# Patient Record
Sex: Female | Born: 1950 | Race: White | Hispanic: No | Marital: Married | Smoking: Current every day smoker
Health system: Southern US, Community
[De-identification: ages and names within clinical notes are randomized; demographics above are authoritative.]

## PROBLEM LIST (undated history)

## (undated) ENCOUNTER — Emergency Department (HOSPITAL_BASED_OUTPATIENT_CLINIC_OR_DEPARTMENT_OTHER): Admission: EM | Payer: Self-pay | Source: Home / Self Care

## (undated) DIAGNOSIS — C3490 Malignant neoplasm of unspecified part of unspecified bronchus or lung: Secondary | ICD-10-CM

## (undated) DIAGNOSIS — IMO0002 Reserved for concepts with insufficient information to code with codable children: Secondary | ICD-10-CM

## (undated) DIAGNOSIS — F419 Anxiety disorder, unspecified: Secondary | ICD-10-CM

## (undated) DIAGNOSIS — M329 Systemic lupus erythematosus, unspecified: Secondary | ICD-10-CM

## (undated) DIAGNOSIS — C55 Malignant neoplasm of uterus, part unspecified: Secondary | ICD-10-CM

## (undated) HISTORY — DX: Malignant neoplasm of unspecified part of unspecified bronchus or lung: C34.90

## (undated) HISTORY — PX: ABDOMINAL HYSTERECTOMY: SHX81

## (undated) HISTORY — PX: APPENDECTOMY: SHX54

## (undated) HISTORY — PX: CHOLECYSTECTOMY: SHX55

---

## 2014-10-17 ENCOUNTER — Ambulatory Visit (INDEPENDENT_AMBULATORY_CARE_PROVIDER_SITE_OTHER): Payer: Self-pay | Admitting: Emergency Medicine

## 2014-10-17 VITALS — BP 126/74 | HR 81 | Temp 98.0°F | Resp 17 | Ht 63.5 in | Wt 108.0 lb

## 2014-10-17 DIAGNOSIS — S335XXA Sprain of ligaments of lumbar spine, initial encounter: Secondary | ICD-10-CM

## 2014-10-17 MED ORDER — CYCLOBENZAPRINE HCL 10 MG PO TABS: 10.0000 mg | ORAL_TABLET | Freq: Three times a day (TID) | ORAL | Status: DC | PRN

## 2014-10-17 MED ORDER — ACETAMINOPHEN-CODEINE #3 300-30 MG PO TABS: 1.0000 | ORAL_TABLET | ORAL | Status: DC | PRN

## 2014-10-17 MED ORDER — NAPROXEN SODIUM 550 MG PO TABS: 550.0000 mg | ORAL_TABLET | Freq: Two times a day (BID) | ORAL | Status: DC

## 2014-10-17 NOTE — Progress Notes (Signed)
[facility] [address], [place] [address] [phone]  Date:  10/17/2014   Name:  Laura Vang   DOB:  1951-05-24   MRN:  [id number]  PCP:  No primary care provider on file.    Chief Complaint: Back Pain   History of Present Illness:  Laura Vang is a 64 y.o. very pleasant female patient who presents with the following:  Patient says three weeks ago she carried in a load of firewood for the fireplace and since has experienced left lower back pain Non radiating.  No neuro symptoms or weakness. No history of fall or other direct injury. Says pain worse with laying down.  Moving around helps pain No improvement with over the counter medications or other home remedies.  No history of prior back injury. Smokes 1 PPD Not working. No improvement with over the counter medications or other home remedies.  Denies other complaint or health concern today.   There are no active problems to display for this patient.   History reviewed. No pertinent past medical history.  Past Surgical History  Procedure Laterality Date  . Appendectomy    . Cholecystectomy      History  Substance Use Topics  . Smoking status: Current Every Day Smoker -- 1.00 packs/day for 41 years    Types: Cigarettes  . Smokeless tobacco: Not on file  . Alcohol Use: No    Family History  Problem Relation Age of Onset  . Cancer Mother   . Cancer Brother     No Known Allergies  Medication list has been reviewed and updated.  No current outpatient prescriptions on file prior to visit.   No current facility-administered medications on file prior to visit.    Review of Systems:  As per HPI, otherwise negative.    Physical Examination: Filed Vitals:   10/17/14 0829  BP: 126/74  Pulse: 81  Temp: 98 F (36.7 C)  Resp: 17   Filed Vitals:   10/17/14 0829  Height: 5' 3.5" (1.613 m)  Weight: 108 lb (48.988 kg)   Body mass index is 18.83 kg/(m^2). Ideal Body  Weight: Weight in (lb) to have BMI = 25: 143.1  GEN: WDWN, NAD, Non-toxic, A & O x 3 HEENT: Atraumatic, Normocephalic. Neck supple. No masses, No LAD. Ears and Nose: No external deformity. CV: RRR, No M/G/R. No JVD. No thrill. No extra heart sounds. PULM: CTA B, no wheezes, crackles, rhonchi. No retractions. No resp. distress. No accessory muscle use. ABD: S, NT, ND, +BS. No rebound. No HSM. EXTR: No c/c/e NEURO Normal gait.  PSYCH: Normally interactive. Conversant. Not depressed or anxious appearing.  Calm demeanor.  BACK:  Tender and spasm left paraspinous muscles.  Motor intact  Assessment and Plan: Lumbar strain Anaprox Flexeril tyl #3   Signed,  Ellison Carwin, MD

## 2014-10-17 NOTE — Patient Instructions (Signed)

## 2014-11-06 ENCOUNTER — Other Ambulatory Visit: Payer: Self-pay

## 2014-11-06 NOTE — Telephone Encounter (Signed)
Patient needs refills for flexeril, tylenol (want an mg increase), and naproxin sent to [facility] on [address].

## 2014-11-07 NOTE — Telephone Encounter (Signed)
Patient's husband came in to follow up on refill request. He was informed that a message will be sent to the doctor and we'll give him a phone a call. Please call Laura Vang! [phone]

## 2014-11-07 NOTE — Telephone Encounter (Signed)
Dr Ouida Sills, do you want to RF these? Pt's husband came into 17 to check on these this morn and I had check in staff tell him we would contact them after you review the req.

## 2014-11-08 NOTE — Telephone Encounter (Signed)
Patient should come n for follow up if still having a problem requiring a narcotic 2 weeks later

## 2014-11-08 NOTE — Telephone Encounter (Signed)
Notified husband that pt needs re-check and he agreed to bring her up to be seen.

## 2014-11-13 ENCOUNTER — Ambulatory Visit (INDEPENDENT_AMBULATORY_CARE_PROVIDER_SITE_OTHER): Payer: Self-pay | Admitting: Internal Medicine

## 2014-11-13 ENCOUNTER — Ambulatory Visit (INDEPENDENT_AMBULATORY_CARE_PROVIDER_SITE_OTHER): Payer: Self-pay

## 2014-11-13 VITALS — BP 124/80 | HR 105 | Temp 98.2°F | Resp 18 | Ht 64.25 in | Wt 104.8 lb

## 2014-11-13 DIAGNOSIS — F17200 Nicotine dependence, unspecified, uncomplicated: Secondary | ICD-10-CM | POA: Insufficient documentation

## 2014-11-13 DIAGNOSIS — S335XXD Sprain of ligaments of lumbar spine, subsequent encounter: Secondary | ICD-10-CM

## 2014-11-13 DIAGNOSIS — Z681 Body mass index (BMI) 19 or less, adult: Secondary | ICD-10-CM | POA: Insufficient documentation

## 2014-11-13 DIAGNOSIS — M5136 Other intervertebral disc degeneration, lumbar region: Secondary | ICD-10-CM

## 2014-11-13 MED ORDER — METHOCARBAMOL 500 MG PO TABS: 500.0000 mg | ORAL_TABLET | Freq: Four times a day (QID) | ORAL | Status: DC | PRN

## 2014-11-13 MED ORDER — HYDROCODONE-ACETAMINOPHEN 5-325 MG PO TABS: 1.0000 | ORAL_TABLET | Freq: Four times a day (QID) | ORAL | Status: DC | PRN

## 2014-11-13 MED ORDER — PREDNISONE 20 MG PO TABS: ORAL_TABLET | ORAL | Status: DC

## 2014-11-13 NOTE — Progress Notes (Signed)
Patient ID: Laura Vang, female    DOB: [birth year], 64 y.o.   MRN: [id number]  PCP: No primary care provider on file.  Subjective:   Chief Complaint  Patient presents with  . Back Pain    lower back pain. x2 month sharp pain, especially at night.     HPI  Back pain x 2 months after lifting a heavy log for the wood burning stove. LEFT side, radiating into the buttock and the LEFT thigh. No paresthesias or lower extremity weakness. No loss of bowel or bladder control. No saddle anesthesia.  Seen here about 3 weeks after the injury, diagnosed with lumbar strain. Given Anaprox, Flexeril and Tylenol #3.No evidence. Increased pain with staying in any one position too long. Pain is keeping her from sleeping, because she cannot be still long enough to fall/stay asleep.  Review of Systems Review of Systems  Constitutional: Negative for fever and chills.  Respiratory: Negative for shortness of breath and wheezing.   Cardiovascular: Negative for chest pain, palpitations and leg swelling.  Gastrointestinal: Negative for blood in stool.  Genitourinary: Negative for dysuria, urgency, frequency and hematuria.  Musculoskeletal: Positive for back pain. Negative for gait problem.  Skin: Negative for rash.  Neurological: Negative for weakness and numbness.       Patient Active Problem List   Diagnosis Date Noted  . Smoker 11/13/2014  . BMI less than 19,adult 11/13/2014     Prior to Admission medications   Not on File     No Known Allergies     Objective:  Physical Exam  Physical Exam  Constitutional: She is oriented to person, place, and time. She appears well-developed and well-nourished. She is active and cooperative. No distress.  BP 124/80 mmHg  Pulse 105  Temp(Src) 98.2 F (36.8 C) (Oral)  Resp 18  Ht 5' 4.25" (1.632 m)  Wt 104 lb 12.8 oz (47.537 kg)  BMI 17.85 kg/m2  SpO2 97%   Eyes: Conjunctivae are normal.  Neck: Neck supple. No thyromegaly present.    Pulmonary/Chest: Effort normal.  Musculoskeletal:       Cervical back: Normal.       Thoracic back: Normal.       Lumbar back: She exhibits tenderness and pain. She exhibits normal range of motion, no bony tenderness, no swelling, no edema, no deformity, no laceration, no spasm and normal pulse.       Back:  Lymphadenopathy:    She has no cervical adenopathy.  Neurological: She is alert and oriented to person, place, and time. She has normal strength. No sensory deficit.  Reflex Scores:      Bicep reflexes are 2+ on the right side and 2+ on the left side.      Patellar reflexes are 2+ on the right side and 2+ on the left side.      Achilles reflexes are 2+ on the right side and 2+ on the left side. Skin: Skin is warm and dry.  Psychiatric: She has a normal mood and affect. Her speech is normal and behavior is normal.   LS Spine: [facility] reading (PRIMARY) by  Dr. Laney Pastor. Surgical clips consistent with cholecystectomy. Loss of lordotic curve. Some anterior spurring. Mild anterolisthesis at L4-L5.          Assessment & Plan:  1. Sprain of lumbar region, subsequent encounter - DG Lumbar Spine Complete; Future  2. Degenerative disc disease, lumbar Acute strain on chronic degenerative disease. Treat with oral prednisone, robaxin and hydrocodone. If  symptoms persist, or improve then recur, RTC. Consider MRI at that time. - predniSONE (DELTASONE) 20 MG tablet; Take 3 PO QAM x3days, 2 PO QAM x3days, 1 PO QAM x3days  Dispense: 18 tablet; Refill: 0 - HYDROcodone-acetaminophen (NORCO) 5-325 MG per tablet; Take 1 tablet by mouth every 6 (six) hours as needed.  Dispense: 30 tablet; Refill: 0 - methocarbamol (ROBAXIN) 500 MG tablet; Take 1 tablet (500 mg total) by mouth every 6 (six) hours as needed for muscle spasms.  Dispense: 30 tablet; Refill: 0   Fara Chute, PA-C Physician Assistant-Certified [facility]  I have participated in the  care of this patient with the Advanced Practice Provider and agree with Diagnosis and Plan as documented. [name] P. Laney Pastor, M.D.

## 2014-11-25 ENCOUNTER — Telehealth: Payer: Self-pay

## 2014-11-25 DIAGNOSIS — M5136 Other intervertebral disc degeneration, lumbar region: Secondary | ICD-10-CM

## 2014-11-25 MED ORDER — HYDROCODONE-ACETAMINOPHEN 5-325 MG PO TABS: 1.0000 | ORAL_TABLET | Freq: Four times a day (QID) | ORAL | Status: DC | PRN

## 2014-11-25 NOTE — Telephone Encounter (Signed)
I have refilled her pain medication. 20 pills with no refills. It is waiting for her at [facility]. Chelle's note states that if pt didn't improve she would need to return for further evaluation and possible imaging. So pt should come in to be seen to determine if a scan is necessary. She will need to come in for any further refills of her pain medicine. If she is having loss of bowel or bladder control she needs to go to the ER ASAP!

## 2014-11-25 NOTE — Telephone Encounter (Signed)
Patients husband came in today stating that his wife was worse and that something needed to be done. He stated that [place] had told her on the 11/13/14 that if it didn't get better then she might need to get an MRI done to see where the pain was coming from. He also stated that she needed a refill on her Vicodin. If there is anyway this can be done ASAP it would be great he said she is up all hours of the night and that he is having to help her get in and out of bed.

## 2014-11-25 NOTE — Telephone Encounter (Signed)
Pt wants refill of hydrocodone due to extreme back pain.

## 2014-11-26 NOTE — Telephone Encounter (Signed)
Tried to call pt. VM box not set up.

## 2014-11-28 NOTE — Telephone Encounter (Signed)
Spoke with pt and gave her message.

## 2014-11-29 ENCOUNTER — Telehealth: Payer: Self-pay

## 2014-11-29 NOTE — Telephone Encounter (Signed)
Spoke with pt, she states she wanted a refill of the Prednisone and Robaxin. i advised her off todd's previous message. He stated pt needed to come in if she was not better. Pt understood.

## 2014-11-29 NOTE — Telephone Encounter (Signed)
Pt states the Dr was going to call her in a muscle relaxer and something for inflammation and it hasn't been taken care of. Please call [place]

## 2014-12-12 ENCOUNTER — Ambulatory Visit (INDEPENDENT_AMBULATORY_CARE_PROVIDER_SITE_OTHER): Payer: Self-pay | Admitting: Physician Assistant

## 2014-12-12 VITALS — BP 140/76 | HR 103 | Temp 98.2°F | Resp 18 | Ht 64.25 in | Wt 101.0 lb

## 2014-12-12 DIAGNOSIS — S335XXD Sprain of ligaments of lumbar spine, subsequent encounter: Secondary | ICD-10-CM

## 2014-12-12 MED ORDER — MELOXICAM 15 MG PO TABS: 15.0000 mg | ORAL_TABLET | Freq: Every day | ORAL | Status: DC

## 2014-12-12 NOTE — Patient Instructions (Signed)
Please use the mobic.  Do not use the Goody powder with this medication.  You may use tylenol as prescribed over the counter with this medication.    Please ice the back 3-4 times per day for 15 minutes.  Then use heat just prior doing your stretches.  Then ice immediately after. Please await contact for MRI scheduling.    Back Exercises These exercises may help you when beginning to rehabilitate your injury. Your symptoms may resolve with or without further involvement from your physician, physical therapist or athletic trainer. While completing these exercises, remember:   Restoring tissue flexibility helps normal motion to return to the joints. This allows healthier, less painful movement and activity.  An effective stretch should be held for at least 30 seconds.  A stretch should never be painful. You should only feel a gentle lengthening or release in the stretched tissue. STRETCH - Extension, Prone on Elbows   Lie on your stomach on the floor, a bed will be too soft. Place your palms about shoulder width apart and at the height of your head.    Place your elbows under your shoulders. If this is too painful, stack pillows under your chest.  Allow your body to relax so that your hips drop lower and make contact more completely with the floor.  Hold this position for __________ seconds.  Slowly return to lying flat on the floor. Repeat __________ times. Complete this exercise __________ times per day.  RANGE OF MOTION - Extension, Prone Press Ups   Lie on your stomach on the floor, a bed will be too soft. Place your palms about shoulder width apart and at the height of your head.  Keeping your back as relaxed as possible, slowly straighten your elbows while keeping your hips on the floor. You may adjust the placement of your hands to maximize your comfort. As you gain motion, your hands will come more underneath your shoulders.  Hold this position __________ seconds.  Slowly  return to lying flat on the floor. Repeat __________ times. Complete this exercise __________ times per day.  RANGE OF MOTION- Quadruped, Neutral Spine   Assume a hands and knees position on a firm surface. Keep your hands under your shoulders and your knees under your hips. You may place padding under your knees for comfort.  Drop your head and point your tail bone toward the ground below you. This will round out your low back like an angry cat. Hold this position for __________ seconds.  Slowly lift your head and release your tail bone so that your back sags into a large arch, like an old horse.  Hold this position for __________ seconds.  Repeat this until you feel limber in your low back.  Now, find your "sweet spot." This will be the most comfortable position somewhere between the two previous positions. This is your neutral spine. Once you have found this position, tense your stomach muscles to support your low back.  Hold this position for __________ seconds. Repeat __________ times. Complete this exercise __________ times per day.  STRETCH - Flexion, Single Knee to Chest   Lie on a firm bed or floor with both legs extended in front of you.  Keeping one leg in contact with the floor, bring your opposite knee to your chest. Hold your leg in place by either grabbing behind your thigh or at your knee.  Pull until you feel a gentle stretch in your low back. Hold __________ seconds.  Slowly  release your grasp and repeat the exercise with the opposite side. Repeat __________ times. Complete this exercise __________ times per day.  STRETCH - Hamstrings, Standing  Stand or sit and extend your right / left leg, placing your foot on a chair or foot stool  Keeping a slight arch in your low back and your hips straight forward.  Lead with your chest and lean forward at the waist until you feel a gentle stretch in the back of your right / left knee or thigh. (When done correctly, this  exercise requires leaning only a small distance.)  Hold this position for __________ seconds. Repeat __________ times. Complete this stretch __________ times per day. STRENGTHENING - Deep Abdominals, Pelvic Tilt   Lie on a firm bed or floor. Keeping your legs in front of you, bend your knees so they are both pointed toward the ceiling and your feet are flat on the floor.  Tense your lower abdominal muscles to press your low back into the floor. This motion will rotate your pelvis so that your tail bone is scooping upwards rather than pointing at your feet or into the floor.  With a gentle tension and even breathing, hold this position for __________ seconds. Repeat __________ times. Complete this exercise __________ times per day.  STRENGTHENING - Abdominals, Crunches   Lie on a firm bed or floor. Keeping your legs in front of you, bend your knees so they are both pointed toward the ceiling and your feet are flat on the floor. Cross your arms over your chest.  Slightly tip your chin down without bending your neck.  Tense your abdominals and slowly lift your trunk high enough to just clear your shoulder blades. Lifting higher can put excessive stress on the low back and does not further strengthen your abdominal muscles.  Control your return to the starting position. Repeat __________ times. Complete this exercise __________ times per day.  STRENGTHENING - Quadruped, Opposite UE/LE Lift   Assume a hands and knees position on a firm surface. Keep your hands under your shoulders and your knees under your hips. You may place padding under your knees for comfort.  Find your neutral spine and gently tense your abdominal muscles so that you can maintain this position. Your shoulders and hips should form a rectangle that is parallel with the floor and is not twisted.  Keeping your trunk steady, lift your right hand no higher than your shoulder and then your left leg no higher than your hip. Make  sure you are not holding your breath. Hold this position __________ seconds.  Continuing to keep your abdominal muscles tense and your back steady, slowly return to your starting position. Repeat with the opposite arm and leg. Repeat __________ times. Complete this exercise __________ times per day. Document Released: 08/16/2005 Document Revised: 10/21/2011 Document Reviewed: 11/10/2008 [facility] Patient Information 2015 [place], [place]. This information is not intended to replace advice given to you by your health care provider. Make sure you discuss any questions you have with your health care provider.

## 2014-12-12 NOTE — Progress Notes (Signed)
[facility] [address] [place] [address] [phone]  Date:  12/12/2014   Name:  Laura Vang   DOB:  Jun 17, 1951   MRN:  [id number]  PCP:  No primary care provider on file.    Chief Complaint: Back Pain   History of Present Illness:  Laura Vang is a 64 y.o. very pleasant female patient who presents with the following:  Patient is here today with continued lower lumbar back pain complaint.  She was seen here 10/17/2014 for this back pain following onset after moving wood.  She states that she has attempted the robaxin and norco which have done nothing for the pain.  When I mention the prednisone taper, she has no understanding of ever taking this medication.  She states that she has no fever, loss of bowel or urination, though she has had a 6lb weight loss within this time.  She takes advil which helps to relieve the pain, but states that she gets the best results from bc powder.  She takes 8 per day mixed with water.  She has no abdominal pain or blood in stool.  She is not doing any stretches, or used any thermotherapy.   Patient Active Problem List   Diagnosis Date Noted  . Smoker 11/13/2014  . BMI less than 19,adult 11/13/2014    History reviewed. No pertinent past medical history.  Past Surgical History  Procedure Laterality Date  . Appendectomy    . Cholecystectomy    . Abdominal hysterectomy      History  Substance Use Topics  . Smoking status: Current Every Day Smoker -- 1.00 packs/day for 41 years    Types: Cigarettes  . Smokeless tobacco: Never Used  . Alcohol Use: No    Family History  Problem Relation Age of Onset  . Cancer Mother   . Cancer Brother   . Heart disease Father     No Known Allergies  Medication list has been reviewed and updated.  Current Outpatient Prescriptions on File Prior to Visit  Medication Sig Dispense Refill  . HYDROcodone-acetaminophen (NORCO) 5-325 MG per tablet Take 1 tablet by mouth every 6  (six) hours as needed. (Patient not taking: Reported on 12/12/2014) 20 tablet 0  . methocarbamol (ROBAXIN) 500 MG tablet Take 1 tablet (500 mg total) by mouth every 6 (six) hours as needed for muscle spasms. (Patient not taking: Reported on 12/12/2014) 30 tablet 0  . predniSONE (DELTASONE) 20 MG tablet Take 3 PO QAM x3days, 2 PO QAM x3days, 1 PO QAM x3days (Patient not taking: Reported on 12/12/2014) 18 tablet 0   No current facility-administered medications on file prior to visit.    Review of Systems: ROS otherwise unremarkable unless listed above.  Physical Examination: Filed Vitals:   12/12/14 0811  BP: 140/76  Pulse: 103  Temp: 98.2 F (36.8 C)  Resp: 18   Filed Vitals:   12/12/14 0811  Height: 5' 4.25" (1.632 m)  Weight: 101 lb (45.813 kg)   Body mass index is 17.2 kg/(m^2). Ideal Body Weight: Weight in (lb) to have BMI = 25: 146.5 Wt Readings from Last 3 Encounters:  12/12/14 101 lb (45.813 kg)  11/13/14 104 lb 12.8 oz (47.537 kg)  10/17/14 108 lb (48.988 kg)   Physical Exam  Constitutional: She is oriented to person, place, and time. She appears well-developed and well-nourished.  HENT:  Head: Normocephalic and atraumatic.  Eyes: EOM are normal. Pupils are equal, round, and reactive to light.  Cardiovascular: Normal rate, regular rhythm and normal heart sounds.  Exam reveals no friction rub.   No murmur heard. Pulmonary/Chest: Effort normal and breath sounds normal. No respiratory distress. She has no wheezes.  Musculoskeletal:  Lower lumbar without visual erythema, or swelling.  Right lower lumbar sensitive with palpation extending inferiorly to the sacrum.  Adjacent musculature tender to palpation.  Forward flexion at 45 degrees.  ROM limited with right lateral flexion due to pain at the left lumbar musculature.  The left side has lateral deviation that is better but still decreased.  Normal patellar reflexes.  Neurological: She is alert and oriented to person, place, and  time.  Psychiatric: She has a normal mood and affect. Her behavior is normal.      Assessment and Plan: 64 year old female is here today complaining of continued back pain.  At this time I am requesting an MRI.  She has very little mobility, and musculature should be improved at this time.  I have advised her to start the mobic, and to discontinue the bc powder and advil.  The bc powder I am suspicious is contributing to her tachycardia at this time, and loss of appetite.  I have given her stretches to do in the meantime as we await imaging.  And have advised icing 3-4 times per day.  Sprain of lumbar region, subsequent encounter - Plan: meloxicam (MOBIC) 15 MG tablet, MR Lumbar Spine Wo Contrast  Ivar Drape, PA-C [facility] 5/4/20166:54 AM

## 2014-12-23 ENCOUNTER — Ambulatory Visit
Admission: RE | Admit: 2014-12-23 | Discharge: 2014-12-23 | Disposition: A | Discharge status: Home / Self Care | Payer: No Typology Code available for payment source | Source: Ambulatory Visit | Attending: Physician Assistant | Admitting: Physician Assistant

## 2014-12-24 ENCOUNTER — Telehealth: Payer: Self-pay | Admitting: Radiology

## 2014-12-24 NOTE — Telephone Encounter (Signed)
Spoke with patient and she reports she will be in first thing on 12/25/14.  Informed her of how concerning these findings were and that she should be seen ASAP, however she says that she will come in in the morning. Philis Fendt, MS, PA-C 11:53 AM, 12/24/2014

## 2014-12-24 NOTE — Telephone Encounter (Signed)
[facility] called about MRI--it showed large mass. I have talked to Legrand [place], [place] about this and he will contact [place].

## 2014-12-25 ENCOUNTER — Ambulatory Visit (INDEPENDENT_AMBULATORY_CARE_PROVIDER_SITE_OTHER): Payer: Self-pay

## 2014-12-25 ENCOUNTER — Ambulatory Visit (HOSPITAL_BASED_OUTPATIENT_CLINIC_OR_DEPARTMENT_OTHER)
Admission: RE | Admit: 2014-12-25 | Discharge: 2014-12-25 | Disposition: A | Discharge status: Home / Self Care | Payer: Self-pay | Source: Ambulatory Visit | Attending: Physician Assistant | Admitting: Physician Assistant

## 2014-12-25 ENCOUNTER — Ambulatory Visit (INDEPENDENT_AMBULATORY_CARE_PROVIDER_SITE_OTHER): Payer: Self-pay | Admitting: Family Medicine

## 2014-12-25 VITALS — BP 136/70 | HR 86 | Temp 98.3°F | Resp 14 | Ht 63.75 in | Wt 104.8 lb

## 2014-12-25 DIAGNOSIS — N390 Urinary tract infection, site not specified: Secondary | ICD-10-CM

## 2014-12-25 DIAGNOSIS — R634 Abnormal weight loss: Secondary | ICD-10-CM

## 2014-12-25 DIAGNOSIS — R229 Localized swelling, mass and lump, unspecified: Secondary | ICD-10-CM

## 2014-12-25 DIAGNOSIS — B3731 Acute candidiasis of vulva and vagina: Secondary | ICD-10-CM

## 2014-12-25 DIAGNOSIS — Z87891 Personal history of nicotine dependence: Secondary | ICD-10-CM

## 2014-12-25 DIAGNOSIS — R8271 Bacteriuria: Secondary | ICD-10-CM

## 2014-12-25 DIAGNOSIS — R222 Localized swelling, mass and lump, trunk: Secondary | ICD-10-CM

## 2014-12-25 DIAGNOSIS — R918 Other nonspecific abnormal finding of lung field: Secondary | ICD-10-CM | POA: Insufficient documentation

## 2014-12-25 DIAGNOSIS — A499 Bacterial infection, unspecified: Secondary | ICD-10-CM

## 2014-12-25 DIAGNOSIS — B9689 Other specified bacterial agents as the cause of diseases classified elsewhere: Secondary | ICD-10-CM

## 2014-12-25 DIAGNOSIS — N760 Acute vaginitis: Secondary | ICD-10-CM

## 2014-12-25 DIAGNOSIS — IMO0002 Reserved for concepts with insufficient information to code with codable children: Secondary | ICD-10-CM

## 2014-12-25 DIAGNOSIS — K5901 Slow transit constipation: Secondary | ICD-10-CM

## 2014-12-25 DIAGNOSIS — B373 Candidiasis of vulva and vagina: Secondary | ICD-10-CM

## 2014-12-25 DIAGNOSIS — J984 Other disorders of lung: Secondary | ICD-10-CM

## 2014-12-25 DIAGNOSIS — R29898 Other symptoms and signs involving the musculoskeletal system: Secondary | ICD-10-CM

## 2014-12-25 LAB — POCT URINALYSIS DIPSTICK
Bilirubin, UA: NEGATIVE
GLUCOSE UA: NEGATIVE
Ketones, UA: NEGATIVE
Nitrite, UA: NEGATIVE
Spec Grav, UA: 1.015
Urobilinogen, UA: 1
pH, UA: 7

## 2014-12-25 LAB — POCT UA - MICROSCOPIC ONLY
CRYSTALS, UR, HPF, POC: NEGATIVE
Casts, Ur, LPF, POC: NEGATIVE
Mucus, UA: NEGATIVE
Yeast, UA: POSITIVE

## 2014-12-25 LAB — POCT WET PREP WITH KOH
KOH Prep POC: NEGATIVE
TRICHOMONAS UA: NEGATIVE
YEAST WET PREP PER HPF POC: NEGATIVE

## 2014-12-25 LAB — TSH: TSH: 2.603 u[IU]/mL (ref 0.350–4.500)

## 2014-12-25 LAB — COMPLETE METABOLIC PANEL WITH GFR
ALT: <8 U/L (ref 0–35)
AST: 8 U/L (ref 0–37)
Albumin: 3.2 g/dL — ABNORMAL LOW (ref 3.5–5.2)
Alkaline Phosphatase: 90 U/L (ref 39–117)
BUN: 11 mg/dL (ref 6–23)
CO2: 29 meq/L (ref 19–32)
CREATININE: 0.5 mg/dL (ref 0.50–1.10)
Calcium: 8.9 mg/dL (ref 8.4–10.5)
Chloride: 97 mEq/L (ref 96–112)
GLUCOSE: 96 mg/dL (ref 70–99)
Potassium: 3.7 mEq/L (ref 3.5–5.3)
SODIUM: 137 meq/L (ref 135–145)
TOTAL PROTEIN: 6.4 g/dL (ref 6.0–8.3)
Total Bilirubin: 0.3 mg/dL (ref 0.2–1.2)

## 2014-12-25 LAB — POCT CBC
Granulocyte percent: 75.9 %G (ref 37–80)
HEMATOCRIT: 34.2 % — AB (ref 37.7–47.9)
HEMOGLOBIN: 11.1 g/dL — AB (ref 12.2–16.2)
Lymph, poc: 2.4 (ref 0.6–3.4)
MCH, POC: 31.3 pg — AB (ref 27–31.2)
MCHC: 32.6 g/dL (ref 31.8–35.4)
MCV: 96 fL (ref 80–97)
MID (cbc): 0.6 (ref 0–0.9)
MPV: 5.5 fL (ref 0–99.8)
PLATELET COUNT, POC: 839 10*3/uL — AB (ref 142–424)
POC Granulocyte: 9.5 — AB (ref 2–6.9)
POC LYMPH %: 19 % (ref 10–50)
POC MID %: 5.1 %M (ref 0–12)
RBC: 3.56 M/uL — AB (ref 4.04–5.48)
RDW, POC: 14.6 %
WBC: 12.5 10*3/uL — AB (ref 4.6–10.2)

## 2014-12-25 LAB — IFOBT (OCCULT BLOOD): IMMUNOLOGICAL FECAL OCCULT BLOOD TEST: NEGATIVE

## 2014-12-25 LAB — C-REACTIVE PROTEIN: CRP: 14 mg/dL — AB (ref <0.60)

## 2014-12-25 LAB — POCT SEDIMENTATION RATE: POCT SED RATE: 37 mm/hr — AB (ref 0–22)

## 2014-12-25 MED ORDER — DOCUSATE SODIUM 100 MG PO CAPS: 100.0000 mg | ORAL_CAPSULE | Freq: Two times a day (BID) | ORAL | Status: AC

## 2014-12-25 MED ORDER — POLYETHYLENE GLYCOL 3350 17 GM/SCOOP PO POWD: 17.0000 g | Freq: Two times a day (BID) | ORAL | Status: AC | PRN

## 2014-12-25 MED ORDER — HYDROCODONE-ACETAMINOPHEN 5-325 MG PO TABS: 1.0000 | ORAL_TABLET | Freq: Four times a day (QID) | ORAL | Status: DC | PRN

## 2014-12-25 MED ORDER — FLUCONAZOLE 150 MG PO TABS: 150.0000 mg | ORAL_TABLET | Freq: Once | ORAL | Status: DC

## 2014-12-25 MED ORDER — METRONIDAZOLE 500 MG PO TABS: 500.0000 mg | ORAL_TABLET | Freq: Two times a day (BID) | ORAL | Status: DC

## 2014-12-25 NOTE — Patient Instructions (Signed)
You are going to have a CAT scan of your chest today at [facility].  You can go directly over there right now. [place]

## 2014-12-25 NOTE — Progress Notes (Signed)
12/25/2014 at 7:51 PM  Laura Vang / DOB: August 18, 1950 / MRN: [id number]  The patient has Smoker and BMI less than 19,adult on her problem list.  SUBJECTIVE  Chief complaint: Back Pain and Follow-up   Patient here for follow up of MRI showing back mass highly suspicious of neoplasm. She has a thirty pack year history of smoking and has lost 15 lbs since first coming to [facility] in mid march.  Denies night sweats, cough, SOB, and chest pain.  She has not received a colonoscopy, and denies having blood in the stool.  She reports constipation. She has never had a mammogram.  She has had a pap in the past but can not remember when, and reports this was normal. She can not remember the last time she went to the doctor.   Interval history: She was first seen by [facility] provider staff on 10/17/14 and diagnosed with lumbar sprain, and her symptoms had been ongoing for roughly 3 weeks at that time. It was thought her injury had occurred 2/2 carrying firewood. She denied neurological impairment at that time and her neurological exam was found to be normal.  She was prescribed opioid pain medication, flexeril and naprosyn for her symptoms. She represented on 4/3 with similar complaints and back radiographs were negative for abnormalities. She was given prednisone, robaxin and hydrocodone for her symptoms. She was seen on 5/02 for subsequent back pain and MRI was ordered of the lumbar spine. She also complained of a 6 lbs weight loss at the time. MRI of spine showed a large left extraosseous paravertebral mass centered at the L2 level.    She  has no past medical history on file.    Medications reviewed and updated by myself where necessary, and exist elsewhere in the encounter.   [name] has No Known Allergies. She  reports that she has been smoking Cigarettes.  She has a 41 pack-year smoking history. She has never used smokeless tobacco. She reports that she does not drink alcohol or use illicit drugs. She   reports that she does not engage in sexual activity. The patient  has past surgical history that includes Appendectomy; Cholecystectomy; and Abdominal hysterectomy.  Her family history includes Cancer in her brother and mother; Heart disease in her father.  Review of Systems  Constitutional: Positive for weight loss and malaise/fatigue. Negative for fever and chills.  HENT: Negative for ear pain, hearing loss and sore throat.   Eyes: Negative.   Respiratory: Negative for cough, shortness of breath and wheezing.   Cardiovascular: Negative for chest pain.  Gastrointestinal: Positive for constipation. Negative for heartburn, nausea, vomiting and abdominal pain.  Genitourinary: Negative.   Skin: Negative for itching and rash.  Neurological: Negative for weakness and headaches.    OBJECTIVE  Her  height is 5' 3.75" (1.619 m) and weight is 104 lb 12.8 oz (47.537 kg). Her oral temperature is 98.3 F (36.8 C). Her blood pressure is 136/70 and her pulse is 86. Her respiration is 14 and oxygen saturation is 97%.  The patient's body mass index is 18.14 kg/(m^2).  Physical Exam  Constitutional: She is oriented to person, place, and time. Vital signs are normal. She appears cachectic. She has a sickly appearance. No distress.  Cardiovascular: Normal rate, regular rhythm and normal heart sounds.   Respiratory: Effort normal and breath sounds normal. No respiratory distress. She has no wheezes. She has no rales. She exhibits no tenderness.  GI: Normal appearance. There is no tenderness.  There is no rebound.  Musculoskeletal:       Arms: Neurological: She is alert and oriented to person, place, and time. She displays no atrophy. No cranial nerve deficit or sensory deficit. She exhibits normal muscle tone. Gait (antalgic) abnormal. Coordination normal. She displays no Babinski's sign on the right side. She displays no Babinski's sign on the left side.  Reflex Scores:      Patellar reflexes are 2+ on the  right side and 2+ on the left side.      Achilles reflexes are 2+ on the right side and 2+ on the left side. Skin: Skin is warm and dry.  Psychiatric:   Mental Status Exam: -Appearance: Disheveled -Eye Contact::  Fair -Psychomotor:  Decreased -Speech:  Slow and soft -Mood:  Euphoric -Affect:  Flat -Thought Process:  Intact -Orientation:  Full (Time, Place, and Person) -Thought Content:  Negative -Suicidal Thoughts:  No -Homicidal Thoughts:  No -Judgement:  Poor -Insight:  Lacking     Results for orders placed or performed in visit on 12/25/14 (from the past 24 hour(s))  TSH     Status: None   Collection Time: 12/25/14  9:41 AM  Result Value Ref Range   TSH 2.603 0.350 - 4.500 uIU/mL   Narrative   Performed at:  [facility]                [address]                [place], [place] [address]  C-reactive protein     Status: Abnormal   Collection Time: 12/25/14  9:41 AM  Result Value Ref Range   CRP 14.0 (H) <0.60 mg/dL   Narrative   Performed at:  [address], [address]                [place], [place] [address]  COMPLETE METABOLIC PANEL WITH GFR     Status: Abnormal   Collection Time: 12/25/14  9:41 AM  Result Value Ref Range   Sodium 137 135 - 145 mEq/L   Potassium 3.7 3.5 - 5.3 mEq/L   Chloride 97 96 - 112 mEq/L   CO2 29 19 - 32 mEq/L   Glucose, Bld 96 70 - 99 mg/dL   BUN 11 6 - 23 mg/dL   Creat 0.50 0.50 - 1.10 mg/dL   Total Bilirubin 0.3 0.2 - 1.2 mg/dL   Alkaline Phosphatase 90 39 - 117 U/L   AST 8 0 - 37 U/L   ALT <8 0 - 35 U/L   Total Protein 6.4 6.0 - 8.3 g/dL   Albumin 3.2 (L) 3.5 - 5.2 g/dL   Calcium 8.9 8.4 - 10.5 mg/dL   GFR, Est African American >89 mL/min   GFR, Est Non African American >89 mL/min   Narrative   Performed at:  [address], [address]                [place], [place] [address]  POCT CBC     Status: Abnormal   Collection Time: 12/25/14  10:05 AM  Result Value Ref Range   WBC 12.5 (A) 4.6 - 10.2 K/uL   Lymph, poc 2.4 0.6 - 3.4   POC LYMPH PERCENT 19.0 10 - 50 %L  MID (cbc) 0.6 0 - 0.9   POC MID % 5.1 0 - 12 %M   POC Granulocyte 9.5 (A) 2 - 6.9   Granulocyte percent 75.9 37 - 80 %G   RBC 3.56 (A) 4.04 - 5.48 M/uL   Hemoglobin 11.1 (A) 12.2 - 16.2 g/dL   HCT, POC 34.2 (A) 37.7 - 47.9 %   MCV 96.0 80 - 97 fL   MCH, POC 31.3 (A) 27 - 31.2 pg   MCHC 32.6 31.8 - 35.4 g/dL   RDW, POC 14.6 %   Platelet Count, POC 839 (A) 142 - 424 K/uL   MPV 5.5 0 - 99.8 fL  POCT UA - Microscopic Only     Status: Abnormal   Collection Time: 12/25/14 10:07 AM  Result Value Ref Range   WBC, Ur, HPF, POC 9-13    RBC, urine, microscopic 1-3    Bacteria, U Microscopic 4+    Mucus, UA negative    Epithelial cells, urine per micros 0-1    Crystals, Ur, HPF, POC negative    Casts, Ur, LPF, POC negative    Yeast, UA positive   POCT urinalysis dipstick     Status: Abnormal   Collection Time: 12/25/14 10:07 AM  Result Value Ref Range   Color, UA yellow    Clarity, UA turbid    Glucose, UA negative    Bilirubin, UA negative    Ketones, UA negaitve    Spec Grav, UA 1.015    Blood, UA moderate    pH, UA 7.0    Protein, UA trace    Urobilinogen, UA 1.0    Nitrite, UA negative    Leukocytes, UA moderate (2+)   POCT SEDIMENTATION RATE     Status: Abnormal   Collection Time: 12/25/14 11:08 AM  Result Value Ref Range   POCT SED RATE 37 (A) 0 - 22 mm/hr  POCT Wet Prep with KOH     Status: None   Collection Time: 12/25/14 11:39 AM  Result Value Ref Range   Trichomonas, UA Negative    Clue Cells Wet Prep HPF POC 0-4    Epithelial Wet Prep HPF POC 2-8    Yeast Wet Prep HPF POC neg    Bacteria Wet Prep HPF POC trace    RBC Wet Prep HPF POC 0-2    WBC Wet Prep HPF POC 0-2    KOH Prep POC Negative   IFOBT POC (occult bld, rslt in office)     Status: None   Collection Time: 12/25/14 11:39 AM  Result Value Ref Range   IFOBT Negative     [facility] reading (PRIMARY) by  Dr. Brigitte Pulse: Large mass in the right upper lobe.  Cardiac sillouette within normal limits.  CT chest without contrast: FINDINGS: No pneumothorax or pleural effusion is noted. Probable scarring is seen posteriorly in the left lung base. However, large multilobulated mass is seen posteriorly in the right upper lobe which is noted to extend across the major fissure into the superior segment of the right lower lobe. It measures 6.5 x 4.9 x 4.7 cm. Ill-defined nodular density measuring 6 mm is noted posteriorly in the right middle lobe. Atherosclerosis of thoracic aorta is noted without aneurysm formation. No definite evidence of mediastinal mass or adenopathy is seen on these unenhanced images. Probable left adrenal adenoma is noted. There is the suggestion of a large left perispinal soft tissue mass seen on the inferior portion of the exam concerning for malignancy or metastatic  disease. Further evaluation with CT scan of the abdomen and pelvis is recommended.  IMPRESSION: 6.5 x 4.9 x 4.7 cm mass is noted predominantly in the posterior portion of the right upper lobe which extends across the major fissure into the superior segment of the right lower lobe. This is consistent with malignancy.  6 mm nodule is noted in the right middle lobe of unknown etiology, but metastatic disease cannot be excluded.  Probable large left paraspinal mass is seen at the level of the left kidney highly concerning for malignancy or metastatic disease. CT scan of the abdomen with intravenous contrast administration is recommended for further evaluation.  ASSESSMENT & PLAN  Quanta was seen today for back pain and follow-up.  Diagnoses and all orders for this visit:  Ardel was seen today for back pain and follow-up.  Diagnoses and all orders for this visit:  Mass of left paraspinous region: Patient with multiple tumors of unknown origin. The large lung mass and history  of smoking certainly lends to the notion that she has a primary lung cancer with mets, however, there is mention of a history of ovarian cancer on one xray report only.  Will send to oncology for further work up.  Patient made aware of abnormal CT findings.  Patients labs suprisingly normal aside from elevated inflammatory markers.   Orders: -     POCT SEDIMENTATION RATE -     Cancel: COMPLETE METABOLIC PANEL WITH GFR -     Pathologist smear review -     C-reactive protein -     POCT Wet Prep with KOH -     Pap IG w/ reflex to HPV when ASC-U -     DG Abd Acute W/Chest -     IFOBT POC (occult bld, rslt in office) -     Ambulatory referral to Oncology -     HYDROcodone-acetaminophen ([place]) 5-325 MG per tablet; Take 1 tablet by mouth every 6 (six) hours as needed. -     COMPLETE METABOLIC PANEL WITH GFR  Right lower lobe lung mass Orders: -     CT Chest Wo Contrast; Future  History of smoking 30 or more pack years Orders: -     Ambulatory referral to Oncology -     CT Chest Wo Contrast; Future  Loss of weight Orders: -     POCT CBC -     POCT UA - Microscopic Only -     POCT urinalysis dipstick -     TSH -     IFOBT POC (occult bld, rslt in office)  Asymptomatic bacteriuria Orders: -     Urine culture  Slow transit constipation Orders: -     docusate sodium (COLACE) 100 MG capsule; Take 1 capsule (100 mg total) by mouth 2 (two) times daily. -     polyethylene glycol powder (GLYCOLAX/MIRALAX) powder; Take 17 g by mouth 2 (two) times daily as needed.  BV (bacterial vaginosis): Metronidazole 500 bid  Yeast vaginitis: Diflucan     The patient was advised to call or come back to clinic if she does not see an improvement in symptoms, or worsens with the above plan.   Philis Fendt, MHS, PA-C [facility] 12/25/2014 7:51 PM       \

## 2014-12-26 ENCOUNTER — Telehealth: Payer: Self-pay | Admitting: Internal Medicine

## 2014-12-26 ENCOUNTER — Other Ambulatory Visit: Payer: Self-pay | Admitting: *Deleted

## 2014-12-26 DIAGNOSIS — R918 Other nonspecific abnormal finding of lung field: Secondary | ICD-10-CM

## 2014-12-26 LAB — PAP IG W/ RFLX HPV ASCU

## 2014-12-26 LAB — PATHOLOGIST SMEAR REVIEW

## 2014-12-26 NOTE — Telephone Encounter (Signed)
NEW PATIENT APPT-S/W PATIENT AND GAVE NP APPT FOR 05/23 @ 1:45 W/DR. MOHAMED

## 2014-12-27 ENCOUNTER — Other Ambulatory Visit: Payer: Self-pay | Admitting: Physician Assistant

## 2014-12-27 DIAGNOSIS — N760 Acute vaginitis: Secondary | ICD-10-CM

## 2014-12-27 DIAGNOSIS — N3000 Acute cystitis without hematuria: Secondary | ICD-10-CM

## 2014-12-27 DIAGNOSIS — B9689 Other specified bacterial agents as the cause of diseases classified elsewhere: Secondary | ICD-10-CM

## 2014-12-27 LAB — URINE CULTURE: Colony Count: >=100000

## 2014-12-27 MED ORDER — CEPHALEXIN 500 MG PO CAPS: 500.0000 mg | ORAL_CAPSULE | Freq: Two times a day (BID) | ORAL | Status: DC

## 2014-12-27 NOTE — Telephone Encounter (Signed)
Spoke with patient regarding UTI, culture and sensitivities. Prescribed Keflex bid.  Sensitivities show good activity with cephalosporins.  Sent metronidazole for BV as well.  Advised her that I will be ordering a PET scan per Dr. Chrystine Oiler recommendations. Philis Fendt, MS, PA-C   7:30 PM, 12/27/2014

## 2014-12-28 ENCOUNTER — Other Ambulatory Visit: Payer: Self-pay | Admitting: Physician Assistant

## 2014-12-28 DIAGNOSIS — R918 Other nonspecific abnormal finding of lung field: Secondary | ICD-10-CM

## 2014-12-28 DIAGNOSIS — R222 Localized swelling, mass and lump, trunk: Secondary | ICD-10-CM

## 2014-12-28 MED ORDER — HYDROCODONE-ACETAMINOPHEN 7.5-325 MG PO TABS: 1.0000 | ORAL_TABLET | Freq: Four times a day (QID) | ORAL | Status: DC | PRN

## 2014-12-28 NOTE — Progress Notes (Signed)
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  Poor pain relief with Norco 5-325, will increase dosage.

## 2014-12-29 NOTE — Progress Notes (Signed)
Thank you :)

## 2014-12-30 ENCOUNTER — Telehealth: Payer: Self-pay | Admitting: Physician Assistant

## 2014-12-30 NOTE — Telephone Encounter (Signed)
Spoke with patient.  Reports that she is urinating without difficulty at this time.  Reports that her pain is tolerable with Norco 7.5-325.  She plans to see Dr. Earlie Server this coming Monday for her initial oncology assessment.  Pet scan is in and awaiting scheduling.  Philis Fendt, MS, PA-C   6:52 PM, 12/30/2014

## 2015-01-02 ENCOUNTER — Encounter: Payer: Self-pay | Admitting: Internal Medicine

## 2015-01-02 ENCOUNTER — Other Ambulatory Visit: Payer: Self-pay | Admitting: Internal Medicine

## 2015-01-02 ENCOUNTER — Telehealth: Payer: Self-pay | Admitting: Internal Medicine

## 2015-01-02 ENCOUNTER — Other Ambulatory Visit (HOSPITAL_BASED_OUTPATIENT_CLINIC_OR_DEPARTMENT_OTHER): Payer: Self-pay

## 2015-01-02 ENCOUNTER — Ambulatory Visit (HOSPITAL_BASED_OUTPATIENT_CLINIC_OR_DEPARTMENT_OTHER): Payer: Self-pay | Admitting: Internal Medicine

## 2015-01-02 ENCOUNTER — Ambulatory Visit: Payer: Self-pay

## 2015-01-02 VITALS — BP 148/73 | HR 96 | Temp 98.3°F | Resp 18 | Ht 63.75 in | Wt 105.4 lb

## 2015-01-02 DIAGNOSIS — R29818 Other symptoms and signs involving the nervous system: Secondary | ICD-10-CM

## 2015-01-02 DIAGNOSIS — D759 Disease of blood and blood-forming organs, unspecified: Secondary | ICD-10-CM

## 2015-01-02 DIAGNOSIS — R918 Other nonspecific abnormal finding of lung field: Secondary | ICD-10-CM

## 2015-01-02 DIAGNOSIS — Z720 Tobacco use: Secondary | ICD-10-CM

## 2015-01-02 DIAGNOSIS — M545 Low back pain: Secondary | ICD-10-CM

## 2015-01-02 DIAGNOSIS — D72829 Elevated white blood cell count, unspecified: Secondary | ICD-10-CM

## 2015-01-02 LAB — COMPREHENSIVE METABOLIC PANEL (CC13)
ALBUMIN: 2.6 g/dL — AB (ref 3.5–5.0)
ALT: 9 U/L (ref 0–55)
AST: 18 U/L (ref 5–34)
Alkaline Phosphatase: 115 U/L (ref 40–150)
Anion Gap: 14 mEq/L — ABNORMAL HIGH (ref 3–11)
BUN: 12.7 mg/dL (ref 7.0–26.0)
CALCIUM: 9 mg/dL (ref 8.4–10.4)
CHLORIDE: 100 meq/L (ref 98–109)
CO2: 26 mEq/L (ref 22–29)
CREATININE: 0.6 mg/dL (ref 0.6–1.1)
Glucose: 106 mg/dl (ref 70–140)
Potassium: 4 mEq/L (ref 3.5–5.1)
SODIUM: 140 meq/L (ref 136–145)
Total Protein: 6.8 g/dL (ref 6.4–8.3)

## 2015-01-02 LAB — CBC WITH DIFFERENTIAL/PLATELET
BASO%: 0.2 % (ref 0.0–2.0)
Basophils Absolute: 0 10*3/uL (ref 0.0–0.1)
EOS%: 0.3 % (ref 0.0–7.0)
Eosinophils Absolute: 0.1 10*3/uL (ref 0.0–0.5)
HEMATOCRIT: 32.6 % — AB (ref 34.8–46.6)
HEMOGLOBIN: 11 g/dL — AB (ref 11.6–15.9)
LYMPH%: 11.1 % — ABNORMAL LOW (ref 14.0–49.7)
MCH: 31.9 pg (ref 25.1–34.0)
MCHC: 33.7 g/dL (ref 31.5–36.0)
MCV: 94.7 fL (ref 79.5–101.0)
MONO#: 1.3 10*3/uL — AB (ref 0.1–0.9)
MONO%: 7.6 % (ref 0.0–14.0)
NEUT#: 14.1 10*3/uL — ABNORMAL HIGH (ref 1.5–6.5)
NEUT%: 80.8 % — AB (ref 38.4–76.8)
Platelets: 1023 10*3/uL — ABNORMAL HIGH (ref 145–400)
RBC: 3.44 10*6/uL — ABNORMAL LOW (ref 3.70–5.45)
RDW: 14.1 % (ref 11.2–14.5)
WBC: 17.4 10*3/uL — AB (ref 3.9–10.3)
lymph#: 1.9 10*3/uL (ref 0.9–3.3)

## 2015-01-02 MED ORDER — DEXAMETHASONE 6 MG PO TABS: 6.0000 mg | ORAL_TABLET | Freq: Four times a day (QID) | ORAL | Status: DC

## 2015-01-02 NOTE — Progress Notes (Signed)
Checked in new pt with no insurance.  Gave pt a Medicaid and financial assistance application to apply for a discount thru the hospital.  Informed pt she will receive a 50 or 55% discount on today's visit because she is self-pay.  She verbalized understanding.

## 2015-01-02 NOTE — Progress Notes (Signed)
[place] [phone]   [phone]  CONSULT NOTE  REFERRING PHYSICIAN: Philis Fendt, PA-C  REASON FOR CONSULTATION:  64 years old white female with questionable metastatic lung cancer.  HPI Laura Vang is a 64 y.o. female with past medical history significant for uterine cancer status post hysterectomy as well as long history of smoking. The patient has been complaining of low back pain since February 2016. She was treated for muscle spasm and low back pain with NSAIDs and pain medication with no improvement. MRI of the lumbar spine was performed on 12/23/2014 and it showed a large LEFT paravertebral mass centered at the L2 level. The majority of the mass is extraosseous, measuring 63 x 56 x 77 Mm. Minor epidural tumor without significant spinal stenosis. This was questionable for a neoplasm but it is unclear whether it is benign or malignant, and from what tissue group it arises. Considerations would include schwannoma, soft tissue sarcoma, or primary bone tremor. Chest x-ray followed by CT scan of the chest were performed on 12/25/2014 and it showed 6.5 x 4.9 x 4.7 cm mass is noted predominantly in the posterior portion of the right upper lobe which extends across the major fissure into the superior segment of the right lower lobe. This is consistent with malignancy. 6 mm nodule is noted in the right middle lobe of unknown etiology, but metastatic disease cannot be excluded. Probable large left paraspinal mass is seen at the level of the left kidney highly concerning for malignancy or metastatic disease. The patient was referred to me today for evaluation and recommendation regarding her condition. She is scheduled for a PET scan on 01/05/2015. When seen today she continues to complain of low back pain as well as numbness in the left thigh. She does not have any evidence for bowel or urine incontinence at this point. The patient denied having any  significant chest pain, shortness breath, cough or hemoptysis. She lost around 10 pounds recently. She denied having any significant night sweats. She has no headache or visual changes. Family history significant for a mother with colon cancer and died at age 68, father died at age 60 with heart disease. She also has a brother who died from lung cancer at age 35. The patient is married and has 2 sons. She was accompanied today by her husband Laura Vang, her sister Laura Vang and daughter-in-law Laura Vang. The patient used to work as a Freight forwarder at [employer]. She has a history of smoking 1 pack per day for around 30 years and unfortunately she continues to smoke. She also has a history of alcohol abuse but not recently and she smokes marijuana regularly.  HPI  History reviewed. No pertinent past medical history.  Past Surgical History  Procedure Laterality Date  . Appendectomy    . Cholecystectomy    . Abdominal hysterectomy      Family History  Problem Relation Age of Onset  . Cancer Mother   . Cancer Brother   . Heart disease Father     Social History History  Substance Use Topics  . Smoking status: Current Every Day Smoker -- 1.00 packs/day for 41 years    Types: Cigarettes  . Smokeless tobacco: Never Used  . Alcohol Use: 7.2 oz/week    0 Standard drinks or equivalent, 12 Cans of beer per week    No Known Allergies  Current Outpatient Prescriptions  Medication Sig Dispense Refill  . cephALEXin (KEFLEX) 500 MG capsule Take 1  capsule (500 mg total) by mouth 2 (two) times daily. 14 capsule 0  . docusate sodium (COLACE) 100 MG capsule Take 1 capsule (100 mg total) by mouth 2 (two) times daily. 10 capsule 0  . fluconazole (DIFLUCAN) 150 MG tablet Take 1 tablet (150 mg total) by mouth once. Repeat if needed 2 tablet 0  . HYDROcodone-acetaminophen (NORCO) 7.5-325 MG per tablet Take 1 tablet by mouth every 6 (six) hours as needed for moderate pain. 30 tablet 0  . metroNIDAZOLE (FLAGYL) 500 MG  tablet Take 1 tablet (500 mg total) by mouth 2 (two) times daily. 14 tablet 0  . polyethylene glycol powder (GLYCOLAX/MIRALAX) powder Take 17 g by mouth 2 (two) times daily as needed. 3350 g 1  . dexamethasone (DECADRON) 6 MG tablet Take 1 tablet (6 mg total) by mouth 4 (four) times daily. 30 tablet 1   No current facility-administered medications for this visit.    Review of Systems  Constitutional: positive for fatigue and weight loss Eyes: negative Ears, nose, mouth, throat, and face: negative Respiratory: negative Cardiovascular: negative Gastrointestinal: negative Genitourinary:negative Integument/breast: negative Hematologic/lymphatic: negative Musculoskeletal:positive for back pain and muscle weakness Neurological: positive for paresthesia Behavioral/Psych: negative Endocrine: negative Allergic/Immunologic: negative  Physical Exam  IWL:NLGXQ, healthy, no distress, well nourished and well developed SKIN: skin color, texture, turgor are normal, no rashes or significant lesions HEAD: Normocephalic, No masses, lesions, tenderness or abnormalities EYES: normal, PERRLA EARS: External ears normal, Canals clear OROPHARYNX:no exudate, no erythema and lips, buccal mucosa, and tongue normal  NECK: supple, no adenopathy, no JVD LYMPH:  no palpable lymphadenopathy, no hepatosplenomegaly BREAST:not examined LUNGS: clear to auscultation , and palpation HEART: regular rate & rhythm, no murmurs and no gallops ABDOMEN:abdomen soft, non-tender, normal bowel sounds and no masses or organomegaly BACK: Back symmetric, no curvature., No CVA tenderness EXTREMITIES:no joint deformities, effusion, or inflammation, no edema, no skin discoloration  NEURO: alert & oriented x 3 with fluent speech, no focal motor/sensory deficits  PERFORMANCE STATUS: ECOG 1  LABORATORY DATA: Lab Results  Component Value Date   WBC 17.4* 01/02/2015   HGB 11.0* 01/02/2015   HCT 32.6* 01/02/2015   MCV 94.7  01/02/2015   PLT 1,023* 01/02/2015      Chemistry      Component Value Date/Time   NA 140 01/02/2015 1405   NA 137 12/25/2014 0941   K 4.0 01/02/2015 1405   K 3.7 12/25/2014 0941   CL 97 12/25/2014 0941   CO2 26 01/02/2015 1405   CO2 29 12/25/2014 0941   BUN 12.7 01/02/2015 1405   BUN 11 12/25/2014 0941   CREATININE 0.6 01/02/2015 1405   CREATININE 0.50 12/25/2014 0941      Component Value Date/Time   CALCIUM 9.0 01/02/2015 1405   CALCIUM 8.9 12/25/2014 0941   ALKPHOS 115 01/02/2015 1405   ALKPHOS 90 12/25/2014 0941   AST 18 01/02/2015 1405   AST 8 12/25/2014 0941   ALT 9 01/02/2015 1405   ALT <8 12/25/2014 0941   BILITOT <0.20 01/02/2015 1405   BILITOT 0.3 12/25/2014 0941       RADIOGRAPHIC STUDIES: Ct Chest Wo Contrast  12/25/2014   CLINICAL DATA:  Lung mass seen on radiograph.  EXAM: CT CHEST WITHOUT CONTRAST  TECHNIQUE: Multidetector CT imaging of the chest was performed following the standard protocol without IV contrast.  COMPARISON:  None.  FINDINGS: No pneumothorax or pleural effusion is noted. Probable scarring is seen posteriorly in the left lung base. However, large multilobulated  mass is seen posteriorly in the right upper lobe which is noted to extend across the major fissure into the superior segment of the right lower lobe. It measures 6.5 x 4.9 x 4.7 cm. Ill-defined nodular density measuring 6 mm is noted posteriorly in the right middle lobe. Atherosclerosis of thoracic aorta is noted without aneurysm formation. No definite evidence of mediastinal mass or adenopathy is seen on these unenhanced images. Probable left adrenal adenoma is noted. There is the suggestion of a large left perispinal soft tissue mass seen on the inferior portion of the exam concerning for malignancy or metastatic disease. Further evaluation with CT scan of the abdomen and pelvis is recommended.  IMPRESSION: 6.5 x 4.9 x 4.7 cm mass is noted predominantly in the posterior portion of the right  upper lobe which extends across the major fissure into the superior segment of the right lower lobe. This is consistent with malignancy.  6 mm nodule is noted in the right middle lobe of unknown etiology, but metastatic disease cannot be excluded.  Probable large left paraspinal mass is seen at the level of the left kidney highly concerning for malignancy or metastatic disease. CT scan of the abdomen with intravenous contrast administration is recommended for further evaluation.  These results will be called to the ordering clinician or representative by the Radiologist Assistant, and communication documented in the PACS or zVision Dashboard.   Electronically Signed   By: Marijo Conception, M.D.   On: 12/25/2014 13:40   Mr Lumbar Spine Wo Contrast  12/24/2014   CLINICAL DATA:  LEFT-sided low back pain attributed to a lifting injury. Remote history of ovarian cancer.  EXAM: MRI LUMBAR SPINE WITHOUT CONTRAST  TECHNIQUE: Multiplanar, multisequence MR imaging of the lumbar spine was performed. No intravenous contrast was administered.  COMPARISON:  None.  FINDINGS: There is a large LEFT paravertebral mass centered at the L2 level. The majority of the mass is extraosseous, measuring 63 x 56 by 77 mm. The mass appears predominantly solid, T2 hypointense with areas of central T2 hyperintensity which could represent necrosis. The mass is not clearly encapsulated. It is adjacent to, of but does not clearly arise from, the LEFT L2 nerve root. The L2 pedicle is spared but there is L2 osseous involvement as seen on sagittal image 9 series 5 for instance. Involvement of the posterior elements on the LEFT at L2 is also observed. Abnormal bone marrow edema pervades the L2 vertebral body which could represent a combination of infiltrative tumor or reactive bone marrow edema. There is slight epidural tumor seen best on coronal T2 weighted images, but no significant stenosis. LEFT-sided foraminal narrowing at this level likely  contributes to LEFT L2 nerve root irritation.  Ordinary spondylosis is seen at the L2-3, L3-4, L4-5, and L5-S1 levels. Conus somewhat low, but not compressed, ending at the L2-3 disc space.  Correlating with prior plain films, the pedicle of L2 on the LEFT is spared.  IMPRESSION: Large LEFT paravertebral mass centered at the L2 vertebral body level. Minor epidural tumor without significant spinal stenosis. I believe this represents a neoplasm but it is unclear whether it is benign or malignant, and from what tissue group it arises. Considerations would include schwannoma, soft tissue sarcoma, or primary bone tremor. With regard to hematopoetic tumors, lymphoma and plasmacytoma would be most likely. Soft tissue metastasis from lung, or breast cancer, less likely ovarian are additional considerations. Post infusion imaging, along with tissue sampling, are warranted.  These results will be  called to the ordering clinician or representative by the Radiologist Assistant, and communication documented in the PACS or zVision Dashboard.   Electronically Signed   By: Rolla Flatten M.D.   On: 12/24/2014 10:04   Dg Abd Acute W/chest  12/25/2014   CLINICAL DATA:  Abdominal pain and 15 lb weight loss.  EXAM: DG ABDOMEN ACUTE W/ 1V CHEST  COMPARISON:  None.  FINDINGS: There is no evidence of dilated bowel loops or free intraperitoneal air. Marked bowel content is identified throughout colon. Prior cholecystectomy clips are identified. There is scoliosis of spine. No radiopaque calculi or other significant radiographic abnormality is seen. Heart size and mediastinal contours are within normal limits. There is a 5.9 x 7.5 cm mass in the medial right upper lobe.  IMPRESSION: Negative abdominal radiographs.  Constipation.  Large mass in the right upper lobe. Further evaluation with a chest CT is recommended.   Electronically Signed   By: Abelardo Diesel M.D.   On: 12/25/2014 10:27    ASSESSMENT: This is a very pleasant 64 years old  white female with questionable metastatic lung cancer but other malignancies cannot be excluded at this point including a myeloproliferative disorder especially with elevated white blood count and platelets count. The patient presented with a large right upper lobe lung mass in addition to paraspinal mass at the L2 level with questionable minor epidural extension. There is no tissue diagnosis available at this point.   PLAN: I had a lengthy discussion with the patient and her family about her current condition and further investigation to confirm a diagnosis and further staging workup. I will order MRI of the brain to rule out brain metastasis. The patient had a PET scan is scheduled for 01/05/2015. I made an urgent referral to radiation oncology for evaluation and treatment of the metastatic lesion at the L2 to avoid any further epidural compromise. I also started the patient on Decadron 6 mg by mouth every 6 hours. I also referred the patient to interventional radiology for consideration of CT-guided core biopsy of the left L2 paraspinal mass. I will see the patient back for follow-up visit in less than 2 weeks for reevaluation and discussion of her treatment options based on the final pathology and staging workup. For pain management she will continue on Vicodin for now. She was advised to call immediately if she has any concerning symptoms in the interval.  The patient voices understanding of current disease status and treatment options and is in agreement with the current care plan.  All questions were answered. The patient knows to call the clinic with any problems, questions or concerns. We can certainly see the patient much sooner if necessary.  Thank you so much for allowing me to participate in the care of Laura Vang. I will continue to follow up the patient with you and assist in her care.  I spent 40 minutes counseling the patient face to face. The total time spent in the  appointment was 60 minutes.  Disclaimer: This note was dictated with voice recognition software. Similar sounding words can inadvertently be transcribed and may not be corrected upon review.   Leafy Motsinger K. Jan 02, 2015, 3:36 PM

## 2015-01-02 NOTE — Telephone Encounter (Signed)
per pof to sch pt appt-sent MM email to see if can get MD only since pt has images ordered-cld & spoke to [facility] in rad on cant sch until after results from Biopsy-she will contact MM nuirse to see when expected-she will call me back once reply

## 2015-01-03 ENCOUNTER — Telehealth: Payer: Self-pay | Admitting: Internal Medicine

## 2015-01-03 ENCOUNTER — Other Ambulatory Visit: Payer: Self-pay | Admitting: Physician Assistant

## 2015-01-03 ENCOUNTER — Encounter: Payer: Self-pay | Admitting: Radiation Oncology

## 2015-01-03 DIAGNOSIS — N3000 Acute cystitis without hematuria: Secondary | ICD-10-CM

## 2015-01-03 NOTE — Telephone Encounter (Signed)
per pof to sch pt appt-ok per MM to use MD only slot-cld & spoke to pt spouse [name] to adv of 6/1 appt-cld & spoke to Santiago Glad to give pt a copy of sch in am-pt undetrstood-Karen printed & put in chart

## 2015-01-03 NOTE — Progress Notes (Signed)
Patient ID: Laura Vang, female   DOB: [birth year], 64 y.o.   MRN: [id number] Pt assessed, xray reviewed, reviewed documentation and agree w/ assessment and plan. Pt informed that likely cancer - need to continue imaging and refer to oncology asap.  Proceed w/ pelvic and pap today since pt has is symptomatic and no pelvic in many years. Delman Cheadle, MD MPH

## 2015-01-03 NOTE — Progress Notes (Addendum)
Histology and Location of Primary Cancer: questionable metastatic lung cancer   Sites of Visceral and Bony Metastatic Disease: The patient presented with a large right upper lobe lung mass in addition to paraspinal mass at the L2 level with questionable minor epidural extension.  Location(s) of Symptomatic Metastases: MRI of the lumbar spine from 12/24/14 shows "There is a large LEFT paravertebral mass centered at the L2 level. The majority of the mass is extraosseous, measuring 63 x 56 by 77 mm.  Past/Anticipated chemotherapy by medical oncology, if any: follow up on 01/11/15  Pain on a scale of 0-10 is: 8/10  On 10 scale low back   If Spine Met(s), symptoms, if any, include: low back pain 8-10/10 scale, npo this am  Bowel/Bladder retention or incontinence frequency voiding, normal bowel movements now, was constipated, smoke 1/2 ppd now, 30 year ppd, hasn't had alcohol in 8 weeks, no smokeless tobacco, no drug use  Numbness or weakness in extremities   Decadron regimen, if applicable: 6 mg four times a day  Ambulatory status? Walker? Wheelchair?: w/c hospital,   SAFETY ISSUES: yes, fell at home yesterday  Prior radiation? NO  Pacemaker/ICD? No  Possible current pregnancy? no  Is the patient on methotrexate? no  Current Complaints / other details:  Married,  2 children, Patient has CT biopsy scheduled on 5/25 and PET scan on 5/26.  She has a history of uterine cancer status post hysterectomy.  Mother deceased colon cancer, Father,deceased MI, brother lung cancer deceased BP 146/80 mmHg  Pulse 94  Temp(Src) 97.9 F (36.6 C) (Oral)  Resp 20  Ht _0  (1.6 m)  Wt 100 lb 6.4 oz (45.541 kg)  BMI 17.79 kg/m2  SpO2 98%  Wt Readings from Last 3 Encounters:  01/02/15 105 lb 6.4 oz (47.809 kg)  12/25/14 104 lb 12.8 oz (47.537 kg)  12/12/14 101 lb (45.813 kg)

## 2015-01-04 ENCOUNTER — Other Ambulatory Visit: Payer: Self-pay | Admitting: Physician Assistant

## 2015-01-04 ENCOUNTER — Ambulatory Visit
Admission: RE | Admit: 2015-01-04 | Discharge: 2015-01-04 | Disposition: A | Discharge status: Home / Self Care | Payer: Self-pay | Source: Ambulatory Visit | Attending: Radiation Oncology | Admitting: Radiation Oncology

## 2015-01-04 ENCOUNTER — Ambulatory Visit (HOSPITAL_COMMUNITY)
Admission: RE | Admit: 2015-01-04 | Discharge: 2015-01-04 | Disposition: A | Discharge status: Home / Self Care | Payer: Self-pay | Source: Ambulatory Visit | Attending: Internal Medicine | Admitting: Internal Medicine

## 2015-01-04 ENCOUNTER — Encounter (HOSPITAL_COMMUNITY): Payer: Self-pay

## 2015-01-04 ENCOUNTER — Encounter: Payer: Self-pay | Admitting: Radiation Oncology

## 2015-01-04 VITALS — BP 146/80 | HR 94 | Temp 97.9°F | Resp 20 | Ht 63.0 in | Wt 100.4 lb

## 2015-01-04 DIAGNOSIS — Z90710 Acquired absence of both cervix and uterus: Secondary | ICD-10-CM | POA: Insufficient documentation

## 2015-01-04 DIAGNOSIS — R222 Localized swelling, mass and lump, trunk: Secondary | ICD-10-CM | POA: Insufficient documentation

## 2015-01-04 DIAGNOSIS — B373 Candidiasis of vulva and vagina: Secondary | ICD-10-CM

## 2015-01-04 DIAGNOSIS — B3731 Acute candidiasis of vulva and vagina: Secondary | ICD-10-CM

## 2015-01-04 DIAGNOSIS — C3490 Malignant neoplasm of unspecified part of unspecified bronchus or lung: Secondary | ICD-10-CM | POA: Insufficient documentation

## 2015-01-04 DIAGNOSIS — C3411 Malignant neoplasm of upper lobe, right bronchus or lung: Secondary | ICD-10-CM

## 2015-01-04 DIAGNOSIS — F17210 Nicotine dependence, cigarettes, uncomplicated: Secondary | ICD-10-CM | POA: Insufficient documentation

## 2015-01-04 DIAGNOSIS — Z8543 Personal history of malignant neoplasm of ovary: Secondary | ICD-10-CM | POA: Insufficient documentation

## 2015-01-04 DIAGNOSIS — Z9049 Acquired absence of other specified parts of digestive tract: Secondary | ICD-10-CM | POA: Insufficient documentation

## 2015-01-04 DIAGNOSIS — C7931 Secondary malignant neoplasm of brain: Secondary | ICD-10-CM | POA: Insufficient documentation

## 2015-01-04 DIAGNOSIS — Z8542 Personal history of malignant neoplasm of other parts of uterus: Secondary | ICD-10-CM | POA: Insufficient documentation

## 2015-01-04 DIAGNOSIS — R21 Rash and other nonspecific skin eruption: Secondary | ICD-10-CM | POA: Insufficient documentation

## 2015-01-04 DIAGNOSIS — R918 Other nonspecific abnormal finding of lung field: Secondary | ICD-10-CM

## 2015-01-04 DIAGNOSIS — C799 Secondary malignant neoplasm of unspecified site: Secondary | ICD-10-CM | POA: Insufficient documentation

## 2015-01-04 DIAGNOSIS — Z510 Encounter for antineoplastic radiation therapy: Secondary | ICD-10-CM | POA: Insufficient documentation

## 2015-01-04 HISTORY — DX: Systemic lupus erythematosus, unspecified: M32.9

## 2015-01-04 HISTORY — DX: Malignant neoplasm of uterus, part unspecified: C55

## 2015-01-04 HISTORY — DX: Anxiety disorder, unspecified: F41.9

## 2015-01-04 HISTORY — DX: Reserved for concepts with insufficient information to code with codable children: IMO0002

## 2015-01-04 LAB — PROTIME-INR
INR: 1.03 (ref 0.00–1.49)
Prothrombin Time: 13.7 seconds (ref 11.6–15.2)

## 2015-01-04 LAB — CBC
HCT: 32.3 % — ABNORMAL LOW (ref 36.0–46.0)
Hemoglobin: 10.9 g/dL — ABNORMAL LOW (ref 12.0–15.0)
MCH: 31.8 pg (ref 26.0–34.0)
MCHC: 33.7 g/dL (ref 30.0–36.0)
MCV: 94.2 fL (ref 78.0–100.0)
Platelets: 810 10*3/uL — ABNORMAL HIGH (ref 150–400)
RBC: 3.43 MIL/uL — ABNORMAL LOW (ref 3.87–5.11)
RDW: 13.8 % (ref 11.5–15.5)
WBC: 10.6 10*3/uL — ABNORMAL HIGH (ref 4.0–10.5)

## 2015-01-04 LAB — APTT: aPTT: 32 seconds (ref 24–37)

## 2015-01-04 MED ORDER — HYDROCODONE-ACETAMINOPHEN 7.5-325 MG PO TABS: 1.0000 | ORAL_TABLET | Freq: Four times a day (QID) | ORAL | Status: DC | PRN

## 2015-01-04 MED ORDER — MIDAZOLAM HCL 2 MG/2ML IJ SOLN
INTRAMUSCULAR | Status: AC | PRN
  Administered 2015-01-04: 1 mg via INTRAVENOUS

## 2015-01-04 MED ORDER — FENTANYL CITRATE (PF) 100 MCG/2ML IJ SOLN
INTRAMUSCULAR | Status: AC | PRN
  Administered 2015-01-04: 50 ug via INTRAVENOUS

## 2015-01-04 MED ORDER — FLUCONAZOLE 100 MG PO TABS: 100.0000 mg | ORAL_TABLET | Freq: Every day | ORAL | Status: DC

## 2015-01-04 MED ORDER — HYDROCODONE-ACETAMINOPHEN 5-325 MG PO TABS
1.0000 | ORAL_TABLET | ORAL | Status: DC | PRN
  Filled 2015-01-04: qty 2

## 2015-01-04 MED ORDER — LORAZEPAM 0.5 MG PO TABS: 0.5000 mg | ORAL_TABLET | Freq: Three times a day (TID) | ORAL | Status: DC

## 2015-01-04 MED ORDER — LIDOCAINE HCL 1 % IJ SOLN
INTRAMUSCULAR | Status: AC
  Filled 2015-01-04: qty 20

## 2015-01-04 MED ORDER — MIDAZOLAM HCL 2 MG/2ML IJ SOLN
INTRAMUSCULAR | Status: AC
  Filled 2015-01-04: qty 2

## 2015-01-04 MED ORDER — FENTANYL CITRATE (PF) 100 MCG/2ML IJ SOLN
INTRAMUSCULAR | Status: AC
  Filled 2015-01-04: qty 2

## 2015-01-04 MED ORDER — SODIUM CHLORIDE 0.9 % IV SOLN
INTRAVENOUS | Status: DC
  Administered 2015-01-04: 10:00:00 via INTRAVENOUS

## 2015-01-04 NOTE — Progress Notes (Signed)
Radiation Oncology         [phone] ________________________________  Initial outpatient Consultation  Name: Laura Vang MRN: [id number]  Date: 01/04/2015  DOB: [birth year]  HK:[id number],DGLOVFI Truman Hayward, PA-C  Curt Bears, MD   REFERRING PHYSICIAN: Curt Bears, MD  DIAGNOSIS: Probable stage IV lung cancer (biopsy pending)  HISTORY OF PRESENT ILLNESS::Laura Vang is a 64 y.o. female who is seen out courtesy of Dr. Julien Nordmann for an opinion concerning radiation therapy for what appears to be stage IV lung cancer.  She has a past medical history significant for uterine cancer status post hysterectomy as well as long history of smoking. The patient has been complaining of low back pain since February 2016. She was treated for muscle spasm and low back pain with NSAIDs and pain medication with no improvement. MRI of the lumbar spine was performed on 12/23/2014 and it showed a large LEFT paravertebral mass centered at the L2 level. The majority of the mass is extraosseous, measuring 6.3 x 5.6 x 7.7 cm.  Minor epidural tumor without significant spinal stenosis. This was questionable for a neoplasm but it is unclear whether it is benign or malignant, and from what tissue group it arises. Considerations would include schwannoma, soft tissue sarcoma, or primary bone tumor. Chest x-ray followed by CT scan of the chest were performed on 12/25/2014 and it showed 6.5 x 4.9 x 4.7 cm mass is noted predominantly in the posterior portion of the right upper lobe which extends across the major fissure into the superior segment of the right lower lobe. This is consistent with malignancy. 6 mm nodule is noted in the right middle lobe of unknown etiology, but metastatic disease cannot be excluded.  She is scheduled for a PET scan on 01/05/2015. When seen today she continues to complain of low back pain as well as numbness in the left thigh. She does not have any evidence for bowel or urine incontinence at this  point. The patient denied having any significant chest pain, shortness breath, cough or hemoptysis. She lost around 10 pounds recently. She denied having any significant night sweats. She has no headache or visual changes. The patient is only taking 1 Decadron thus far due to medication shortage. The patient and family understand the importance of taking this since medication is now available.  PREVIOUS RADIATION THERAPY: No  PAST MEDICAL HISTORY:  has a past medical history of Uterine cancer; Anxiety; and Lupus.    PAST SURGICAL HISTORY: Past Surgical History  Procedure Laterality Date  . Appendectomy    . Cholecystectomy    . Abdominal hysterectomy      FAMILY HISTORY: family history includes Cancer in her brother and mother; Heart disease in her father.  SOCIAL HISTORY:  reports that she has been smoking Cigarettes.  She has a 41 pack-year smoking history. She has never used smokeless tobacco. She reports that she drinks about 7.2 oz of alcohol per week. She reports that she uses illicit drugs.  ALLERGIES: Review of patient's allergies indicates no known allergies.  MEDICATIONS:  Current Outpatient Prescriptions  Medication Sig Dispense Refill  . cephALEXin (KEFLEX) 500 MG capsule Take 1 capsule (500 mg total) by mouth 2 (two) times daily. 14 capsule 0  . dexamethasone (DECADRON) 6 MG tablet Take 1 tablet (6 mg total) by mouth 4 (four) times daily. 30 tablet 1  . docusate sodium (COLACE) 100 MG capsule Take 1 capsule (100 mg total) by mouth 2 (two) times daily. 10 capsule 0  . HYDROcodone-acetaminophen ([place])  7.5-325 MG per tablet Take 1 tablet by mouth every 6 (six) hours as needed for moderate pain. 30 tablet 0  . metroNIDAZOLE (FLAGYL) 500 MG tablet Take 1 tablet (500 mg total) by mouth 2 (two) times daily. 14 tablet 0  . fluconazole (DIFLUCAN) 100 MG tablet Take 1 tablet (100 mg total) by mouth daily. 8 tablet 0  . LORazepam (ATIVAN) 0.5 MG tablet Take 1 tablet (0.5 mg total) by  mouth every 8 (eight) hours. 30 tablet 0  . polyethylene glycol powder (GLYCOLAX/MIRALAX) powder Take 17 g by mouth 2 (two) times daily as needed. (Patient not taking: Reported on 01/04/2015) 3350 g 1   No current facility-administered medications for this encounter.    REVIEW OF SYSTEMS:  A 15 point review of systems is documented in the electronic medical record. This was obtained by the nursing staff. However, I reviewed this with the patient to discuss relevant findings and make appropriate changes. Constitutional: positive for fatigue and weight loss Eyes: negative Ears, nose, mouth, throat, and face: negative Respiratory: negative Cardiovascular: negative Gastrointestinal: negative Genitourinary:negative Integument/breast: negative Hematologic/lymphatic: negative Musculoskeletal:positive for low back pain and muscle weakness Neurological: positive for paresthesia left upper thigh, no left leg weakness Behavioral/Psych: negative Endocrine: negative Allergic/Immunologic: negative   PHYSICAL EXAM:  height is '5\' 3"'$  (1.6 m) and weight is 100 lb 6.4 oz (45.541 kg). Her oral temperature is 97.9 F (36.6 C). Her blood pressure is 146/80 and her pulse is 94. Her respiration is 20 and oxygen saturation is 98%.   :alert, healthy, no distress, thin female, lying in the recliner in light of her significant low back pain SKIN: skin color, texture, turgor are normal, no rashes or significant lesions, abrasion along the left upper lip area from fall last night HEAD: Normocephalic, No masses, lesions, tenderness or abnormalities EYES: normal, PERRLA OROPHARYNX:no exudate, no erythema and lips, buccal mucosa, and tongue normal, persistent candidal infection along the dorsum of the tongue  NECK: supple, no adenopathy, no JVD LYMPH: no palpable lymphadenopathy, no hepatosplenomegaly BREAST:not examined LUNGS: Inspiratory wheezing noted along the right lung field HEART: regular rate & rhythm, no  murmurs and no gallops ABDOMEN:abdomen soft, non-tender, normal bowel sounds and no masses or organomegaly BACK: Back symmetric, no curvature., Left lumbar paraspinal pain with palpation EXTREMITIES:no joint deformities, effusion, or inflammation, no edema, no skin discoloration  NEURO: alert & oriented x 3 with fluent speech, no focal motor/sensory deficits   ECOG = 1   1 - Symptomatic but completely ambulatory (Restricted in physically strenuous activity but ambulatory and able to carry out work of a light or sedentary nature. For example, light housework, office work)   LABORATORY DATA:  Lab Results  Component Value Date   WBC 17.4* 01/02/2015   HGB 11.0* 01/02/2015   HCT 32.6* 01/02/2015   MCV 94.7 01/02/2015   PLT 1,023* 01/02/2015   NEUTROABS 14.1* 01/02/2015   Lab Results  Component Value Date   NA 140 01/02/2015   K 4.0 01/02/2015   CL 97 12/25/2014   CO2 26 01/02/2015   GLUCOSE 106 01/02/2015   CREATININE 0.6 01/02/2015   CALCIUM 9.0 01/02/2015      RADIOGRAPHY: Ct Chest Wo Contrast  12/25/2014   CLINICAL DATA:  Lung mass seen on radiograph.  EXAM: CT CHEST WITHOUT CONTRAST  TECHNIQUE: Multidetector CT imaging of the chest was performed following the standard protocol without IV contrast.  COMPARISON:  None.  FINDINGS: No pneumothorax or pleural effusion is noted. Probable scarring  is seen posteriorly in the left lung base. However, large multilobulated mass is seen posteriorly in the right upper lobe which is noted to extend across the major fissure into the superior segment of the right lower lobe. It measures 6.5 x 4.9 x 4.7 cm. Ill-defined nodular density measuring 6 mm is noted posteriorly in the right middle lobe. Atherosclerosis of thoracic aorta is noted without aneurysm formation. No definite evidence of mediastinal mass or adenopathy is seen on these unenhanced images. Probable left adrenal adenoma is noted. There is the suggestion of a large left perispinal  soft tissue mass seen on the inferior portion of the exam concerning for malignancy or metastatic disease. Further evaluation with CT scan of the abdomen and pelvis is recommended.  IMPRESSION: 6.5 x 4.9 x 4.7 cm mass is noted predominantly in the posterior portion of the right upper lobe which extends across the major fissure into the superior segment of the right lower lobe. This is consistent with malignancy.  6 mm nodule is noted in the right middle lobe of unknown etiology, but metastatic disease cannot be excluded.  Probable large left paraspinal mass is seen at the level of the left kidney highly concerning for malignancy or metastatic disease. CT scan of the abdomen with intravenous contrast administration is recommended for further evaluation.  These results will be called to the ordering clinician or representative by the Radiologist Assistant, and communication documented in the PACS or zVision Dashboard.   Electronically Signed   By: Marijo Conception, M.D.   On: 12/25/2014 13:40   Mr Lumbar Spine Wo Contrast  12/24/2014   CLINICAL DATA:  LEFT-sided low back pain attributed to a lifting injury. Remote history of ovarian cancer.  EXAM: MRI LUMBAR SPINE WITHOUT CONTRAST  TECHNIQUE: Multiplanar, multisequence MR imaging of the lumbar spine was performed. No intravenous contrast was administered.  COMPARISON:  None.  FINDINGS: There is a large LEFT paravertebral mass centered at the L2 level. The majority of the mass is extraosseous, measuring 63 x 56 by 77 mm. The mass appears predominantly solid, T2 hypointense with areas of central T2 hyperintensity which could represent necrosis. The mass is not clearly encapsulated. It is adjacent to, of but does not clearly arise from, the LEFT L2 nerve root. The L2 pedicle is spared but there is L2 osseous involvement as seen on sagittal image 9 series 5 for instance. Involvement of the posterior elements on the LEFT at L2 is also observed. Abnormal bone marrow edema  pervades the L2 vertebral body which could represent a combination of infiltrative tumor or reactive bone marrow edema. There is slight epidural tumor seen best on coronal T2 weighted images, but no significant stenosis. LEFT-sided foraminal narrowing at this level likely contributes to LEFT L2 nerve root irritation.  Ordinary spondylosis is seen at the L2-3, L3-4, L4-5, and L5-S1 levels. Conus somewhat low, but not compressed, ending at the L2-3 disc space.  Correlating with prior plain films, the pedicle of L2 on the LEFT is spared.  IMPRESSION: Large LEFT paravertebral mass centered at the L2 vertebral body level. Minor epidural tumor without significant spinal stenosis. I believe this represents a neoplasm but it is unclear whether it is benign or malignant, and from what tissue group it arises. Considerations would include schwannoma, soft tissue sarcoma, or primary bone tremor. With regard to hematopoetic tumors, lymphoma and plasmacytoma would be most likely. Soft tissue metastasis from lung, or breast cancer, less likely ovarian are additional considerations. Post infusion imaging,  along with tissue sampling, are warranted.  These results will be called to the ordering clinician or representative by the Radiologist Assistant, and communication documented in the PACS or zVision Dashboard.   Electronically Signed   By: Rolla Flatten M.D.   On: 12/24/2014 10:04   Dg Abd Acute W/chest  12/25/2014   CLINICAL DATA:  Abdominal pain and 15 lb weight loss.  EXAM: DG ABDOMEN ACUTE W/ 1V CHEST  COMPARISON:  None.  FINDINGS: There is no evidence of dilated bowel loops or free intraperitoneal air. Marked bowel content is identified throughout colon. Prior cholecystectomy clips are identified. There is scoliosis of spine. No radiopaque calculi or other significant radiographic abnormality is seen. Heart size and mediastinal contours are within normal limits. There is a 5.9 x 7.5 cm mass in the medial right upper lobe.   IMPRESSION: Negative abdominal radiographs.  Constipation.  Large mass in the right upper lobe. Further evaluation with a chest CT is recommended.   Electronically Signed   By: Abelardo Diesel M.D.   On: 12/25/2014 10:27      IMPRESSION: Probable stage IV lung cancer. The patient will be a good candidate for palliative radiation therapy directed at the left lumbar spine mass. I would also consider palliative radiation therapy for her large mass in the right upper chest pending biopsy results. Today I discussed the course of treatment side effects and potential toxicities of radiation treatment with the patient and her family. She appears to understand and wishes to proceed with planned course of treatment.  PLAN: Simulation and planning May 26 with treatments to begin as soon as biopsy results are available. Patient will continue on hydrocodone and Decadron for pain issues. Patient has persistent oral pharyngeal candidiasis and will be restarted on Diflucan for 7 day course. Patient is quite anxious and will be placed on Ativan. I did refill her hydrocodone prescription today. Patient will proceed with biopsy later today and PET scan tomorrow.  I spent 60 minutes minutes face to face with the patient and more than 50% of that time was spent in counseling and/or coordination of care.   ------------------------------------------------  Blair Promise, PhD, MD

## 2015-01-04 NOTE — Sedation Documentation (Signed)
Patient is resting comfortably. 

## 2015-01-04 NOTE — Discharge Instructions (Signed)
Biopsy Care After These instructions give you information on caring for yourself after your procedure. Your doctor may also give you more specific instructions. Call your doctor if you have any problems or questions after your procedure. HOME CARE   Return to your normal diet and activities as told by your doctor.  Change your bandages (dressings) as told by your doctor. If skin glue (adhesive) was used, it will peel off in 7 days.  Only take medicines as told by your doctor.  Ask your doctor when you can bathe and get your wound wet. GET HELP RIGHT AWAY IF:  You see more than a small spot of blood coming from the wound.  You have redness, puffiness (swelling), or pain.  You see yellowish-white fluid (pus) coming from the wound.  You have a fever.  You notice a bad smell coming from the wound or bandage.  You have a rash, trouble breathing, or any allergy problems. MAKE SURE YOU:   Understand these instructions.  Will watch your condition.  Will get help right away if you are not doing well or get worse. Document Released: 04/02/2011 Document Revised: 10/21/2011 Document Reviewed: 04/02/2011 Exit[employer] Patient Information 2015 [employer]. This information is not intended to replace advice given to you by your health care provider. Make sure you discuss any questions you have with your health care provider.  

## 2015-01-04 NOTE — Procedures (Signed)
Procedure:  CT guided core biopsy of lumbar paraspinal mass. Findings:  6 cm destructive left paraspinal soft tissue mass centered at L2 level. 18 G core biopsy x 4 via 17 G needle. No complications.  EBL <10 mL  Denai Caba T. Kathlene Cote, M.D Pager:  [phone]

## 2015-01-04 NOTE — H&P (Signed)
Chief Complaint: Lung Cancer Paraspinal mass  Referring Physician(s): Mohamed,Mohamed  History of Present Illness:  Laura Vang is a 64 y.o. female  with past medical history significant for uterine cancer status post hysterectomy as well as long history of smoking.   She initially complained of low back pain which started around February 2016. She was treated for muscle spasm and low back pain with NSAIDs and pain medication with no improvement.   MRI of the lumbar spine was performed on 12/23/2014 and it showed a large LEFT paravertebral mass centered at the L2 level. This is questionable for a neoplasm   Chest x-ray followed by CT scan of the chest were performed on 12/25/2014 and it showed 6.5 x 4.9 x 4.7 cm mass is noted predominantly in the posterior portion of the right upper lobe which extends across the major fissure into the superior segment of the right lower lobe consistent with malignancy.   We are asked to perform an image guided biopsy of the paraspinal lesion.  Past Medical History  Diagnosis Date  . Uterine cancer   . Anxiety   . Lupus     Past Surgical History  Procedure Laterality Date  . Appendectomy    . Cholecystectomy    . Abdominal hysterectomy      Allergies: Review of patient's allergies indicates no known allergies.  Medications: Prior to Admission medications   Medication Sig Start Date End Date Taking? Authorizing Provider  Aspirin-Salicylamide-Caffeine (BC HEADACHE POWDER PO) Take 1-2 Packages by mouth 2 (two) times daily as needed (pain).   Yes Historical Provider, MD  cephALEXin (KEFLEX) 500 MG capsule Take 1 capsule (500 mg total) by mouth 2 (two) times daily. 12/27/14  Yes Tereasa Coop, PA-C  dexamethasone (DECADRON) 6 MG tablet Take 1 tablet (6 mg total) by mouth 4 (four) times daily. 01/02/15  Yes Curt Bears, MD  docusate sodium (COLACE) 100 MG capsule Take 1 capsule (100 mg total) by mouth 2 (two) times daily. 12/25/14   Yes Tereasa Coop, PA-C  fluconazole (DIFLUCAN) 100 MG tablet Take 1 tablet (100 mg total) by mouth daily. 01/04/15  Yes Gery Pray, MD  HYDROcodone-acetaminophen (NORCO) 7.5-325 MG per tablet Take 1 tablet by mouth every 6 (six) hours as needed for moderate pain. 01/04/15  Yes Gery Pray, MD  LORazepam (ATIVAN) 0.5 MG tablet Take 1 tablet (0.5 mg total) by mouth every 8 (eight) hours. 01/04/15  Yes Gery Pray, MD  metroNIDAZOLE (FLAGYL) 500 MG tablet Take 1 tablet (500 mg total) by mouth 2 (two) times daily. 12/25/14  Yes Tereasa Coop, PA-C  polyethylene glycol powder (GLYCOLAX/MIRALAX) powder Take 17 g by mouth 2 (two) times daily as needed. 12/25/14  Yes Tereasa Coop, PA-C     Family History  Problem Relation Age of Onset  . Cancer Mother   . Cancer Brother   . Heart disease Father     History   Social History  . Marital Status: Married    Spouse Name: [name]  . Number of Children: 2  . Years of Education: 12th grade   Occupational History  . retired     Engineer, agricultural   Social History Main Topics  . Smoking status: Current Every Day Smoker -- 1.00 packs/day for 41 years    Types: Cigarettes  . Smokeless tobacco: Never Used  . Alcohol Use: 7.2 oz/week    0 Standard drinks or equivalent, 12 Cans of beer per week  . Drug  Use: Yes  . Sexual Activity: No   Other Topics Concern  . None   Social History Narrative   Lives with her husband. One son lives in [place], [place], the other in [place], [place].     Review of Systems: A 12 point ROS discussed and pertinent positives are indicated in the HPI above.  All other systems are negative.  Review of Systems  Constitutional: Positive for activity change and fatigue. Negative for fever and appetite change.  Respiratory: Negative for chest tightness and shortness of breath.   Cardiovascular: Negative for chest pain.  Gastrointestinal: Negative for nausea, vomiting, abdominal pain and abdominal distention.   Musculoskeletal: Positive for back pain. Negative for arthralgias.  Skin: Negative.   Neurological: Negative.   Psychiatric/Behavioral: Negative.     Vital Signs: BP 154/86 mmHg  Pulse 99  Temp(Src) 98.1 F (36.7 C) (Oral)  Resp 18  Ht '5\' 3"'$  (1.6 m)  Wt 100 lb (45.36 kg)  BMI 17.72 kg/m2  SpO2 96%  Physical Exam  Constitutional: She is oriented to person, place, and time.  Thin, NAD  HENT:  Head: Normocephalic and atraumatic.  Eyes: EOM are normal.  Neck: Normal range of motion. Neck supple.  Cardiovascular: Normal rate and regular rhythm.   Pulmonary/Chest: Effort normal and breath sounds normal.  Abdominal: Soft. Bowel sounds are normal.  Musculoskeletal: Normal range of motion.  Neurological: She is alert and oriented to person, place, and time.  Skin: Skin is warm and dry.  Psychiatric: She has a normal mood and affect. Her behavior is normal. Judgment and thought content normal.  Nursing note and vitals reviewed.   Mallampati Score:  MD Evaluation Airway: WNL Heart: WNL Abdomen: WNL Chest/ Lungs: WNL ASA  Classification: 3 Mallampati/Airway Score: Two  Imaging: Ct Chest Wo Contrast  12/25/2014   CLINICAL DATA:  Lung mass seen on radiograph.  EXAM: CT CHEST WITHOUT CONTRAST  TECHNIQUE: Multidetector CT imaging of the chest was performed following the standard protocol without IV contrast.  COMPARISON:  None.  FINDINGS: No pneumothorax or pleural effusion is noted. Probable scarring is seen posteriorly in the left lung base. However, large multilobulated mass is seen posteriorly in the right upper lobe which is noted to extend across the major fissure into the superior segment of the right lower lobe. It measures 6.5 x 4.9 x 4.7 cm. Ill-defined nodular density measuring 6 mm is noted posteriorly in the right middle lobe. Atherosclerosis of thoracic aorta is noted without aneurysm formation. No definite evidence of mediastinal mass or adenopathy is seen on these  unenhanced images. Probable left adrenal adenoma is noted. There is the suggestion of a large left perispinal soft tissue mass seen on the inferior portion of the exam concerning for malignancy or metastatic disease. Further evaluation with CT scan of the abdomen and pelvis is recommended.  IMPRESSION: 6.5 x 4.9 x 4.7 cm mass is noted predominantly in the posterior portion of the right upper lobe which extends across the major fissure into the superior segment of the right lower lobe. This is consistent with malignancy.  6 mm nodule is noted in the right middle lobe of unknown etiology, but metastatic disease cannot be excluded.  Probable large left paraspinal mass is seen at the level of the left kidney highly concerning for malignancy or metastatic disease. CT scan of the abdomen with intravenous contrast administration is recommended for further evaluation.  These results will be called to the ordering clinician or representative by the Radiologist Assistant,  and communication documented in the PACS or zVision Dashboard.   Electronically Signed   By: Marijo Conception, M.D.   On: 12/25/2014 13:40   Mr Lumbar Spine Wo Contrast  12/24/2014   CLINICAL DATA:  LEFT-sided low back pain attributed to a lifting injury. Remote history of ovarian cancer.  EXAM: MRI LUMBAR SPINE WITHOUT CONTRAST  TECHNIQUE: Multiplanar, multisequence MR imaging of the lumbar spine was performed. No intravenous contrast was administered.  COMPARISON:  None.  FINDINGS: There is a large LEFT paravertebral mass centered at the L2 level. The majority of the mass is extraosseous, measuring 63 x 56 by 77 mm. The mass appears predominantly solid, T2 hypointense with areas of central T2 hyperintensity which could represent necrosis. The mass is not clearly encapsulated. It is adjacent to, of but does not clearly arise from, the LEFT L2 nerve root. The L2 pedicle is spared but there is L2 osseous involvement as seen on sagittal image 9 series 5 for  instance. Involvement of the posterior elements on the LEFT at L2 is also observed. Abnormal bone marrow edema pervades the L2 vertebral body which could represent a combination of infiltrative tumor or reactive bone marrow edema. There is slight epidural tumor seen best on coronal T2 weighted images, but no significant stenosis. LEFT-sided foraminal narrowing at this level likely contributes to LEFT L2 nerve root irritation.  Ordinary spondylosis is seen at the L2-3, L3-4, L4-5, and L5-S1 levels. Conus somewhat low, but not compressed, ending at the L2-3 disc space.  Correlating with prior plain films, the pedicle of L2 on the LEFT is spared.  IMPRESSION: Large LEFT paravertebral mass centered at the L2 vertebral body level. Minor epidural tumor without significant spinal stenosis. I believe this represents a neoplasm but it is unclear whether it is benign or malignant, and from what tissue group it arises. Considerations would include schwannoma, soft tissue sarcoma, or primary bone tremor. With regard to hematopoetic tumors, lymphoma and plasmacytoma would be most likely. Soft tissue metastasis from lung, or breast cancer, less likely ovarian are additional considerations. Post infusion imaging, along with tissue sampling, are warranted.  These results will be called to the ordering clinician or representative by the Radiologist Assistant, and communication documented in the PACS or zVision Dashboard.   Electronically Signed   By: Rolla Flatten M.D.   On: 12/24/2014 10:04   Dg Abd Acute W/chest  12/25/2014   CLINICAL DATA:  Abdominal pain and 15 lb weight loss.  EXAM: DG ABDOMEN ACUTE W/ 1V CHEST  COMPARISON:  None.  FINDINGS: There is no evidence of dilated bowel loops or free intraperitoneal air. Marked bowel content is identified throughout colon. Prior cholecystectomy clips are identified. There is scoliosis of spine. No radiopaque calculi or other significant radiographic abnormality is seen. Heart size  and mediastinal contours are within normal limits. There is a 5.9 x 7.5 cm mass in the medial right upper lobe.  IMPRESSION: Negative abdominal radiographs.  Constipation.  Large mass in the right upper lobe. Further evaluation with a chest CT is recommended.   Electronically Signed   By: Abelardo Diesel M.D.   On: 12/25/2014 10:27    Labs:  CBC:  Recent Labs  12/25/14 1005 01/02/15 1405 01/04/15 1051  WBC 12.5* 17.4* 10.6*  HGB 11.1* 11.0* 10.9*  HCT 34.2* 32.6* 32.3*  PLT  --  1,023* 810*    COAGS: No results for input(s): INR, APTT in the last 8760 hours.  BMP:  Recent Labs  12/25/14 0941 01/02/15 1405  NA 137 140  K 3.7 4.0  CL 97  --   CO2 29 26  GLUCOSE 96 106  BUN 11 12.7  CALCIUM 8.9 9.0  CREATININE 0.50 0.6  GFRNONAA >89  --   GFRAA >89  --     LIVER FUNCTION TESTS:  Recent Labs  12/25/14 0941 01/02/15 1405  BILITOT 0.3 <0.20  AST 8 18  ALT <8 9  ALKPHOS 90 115  PROT 6.4 6.8  ALBUMIN 3.2* 2.6*    TUMOR MARKERS: No results for input(s): AFPTM, CEA, CA199, CHROMGRNA in the last 8760 hours.  Assessment and Plan:  Lung Cancer Paraspinal mass  Will proceed with Image guided biopsy of the Large L2 paraspinal mass today by Dr. Kathlene Cote.  Risks and Benefits discussed with the patient including, but not limited to bleeding, infection, damage to adjacent structures or low yield requiring additional tests. All of the patient's questions were answered, patient is agreeable to proceed. Consent signed and in chart.   Thank you for this interesting consult.  I greatly enjoyed meeting Laura Vang and look forward to participating in their care.  SignedNevin Bloodgood 01/04/2015, 11:25 AM   I spent a total of 20 Minutes    in face to face in clinical consultation, greater than 50% of which was counseling/coordinating care for image guided biopsy of paraspinal mass.

## 2015-01-05 ENCOUNTER — Ambulatory Visit
Admission: RE | Admit: 2015-01-05 | Discharge: 2015-01-05 | Disposition: A | Discharge status: Home / Self Care | Payer: Self-pay | Source: Ambulatory Visit | Attending: Radiation Oncology | Admitting: Radiation Oncology

## 2015-01-05 ENCOUNTER — Ambulatory Visit (HOSPITAL_COMMUNITY): Payer: Self-pay

## 2015-01-05 DIAGNOSIS — R222 Localized swelling, mass and lump, trunk: Secondary | ICD-10-CM

## 2015-01-05 NOTE — Progress Notes (Signed)
  Radiation Oncology         (336) [phone] ________________________________  Name: Laura Vang MRN: [id number]  Date: 01/05/2015  DOB: 02-11-1951  SIMULATION AND TREATMENT PLANNING NOTE    ICD-9-CM ICD-10-CM   1. Paraspinal mass 781.99 R29.818     DIAGNOSIS:  Probable stage IV lung cancer (pathology pending)  NARRATIVE:  The patient was brought to the [facility].  Identity was confirmed.  All relevant records and images related to the planned course of therapy were reviewed.  The patient freely provided informed written consent to proceed with treatment after reviewing the details related to the planned course of therapy. The consent form was witnessed and verified by the simulation staff.  Then, the patient was set-up in a stable reproducible  supine position for radiation therapy.  CT images were obtained.  Surface markings were placed.  The CT images were loaded into the planning software.  Then the target and avoidance structures were contoured.  Treatment planning then occurred.  The radiation prescription was entered and confirmed.  Then, I designed and supervised the construction of a total of 1 medically necessary complex treatment devices.  I have requested : 3D Simulation  I have requested a DVH of the following structures: GTV, PTV, spinal cord, kidneys.  I have ordered:dose calc.  PLAN:  The patient will receive 35 Gy in 14 fractions. Tentative start date May 31.  ________________________________  -----------------------------------  Blair Promise, PhD, MD

## 2015-01-06 ENCOUNTER — Encounter (HOSPITAL_COMMUNITY)
Admission: RE | Admit: 2015-01-06 | Discharge: 2015-01-06 | Disposition: A | Discharge status: Home / Self Care | Payer: Self-pay | Source: Ambulatory Visit | Attending: Physician Assistant | Admitting: Physician Assistant

## 2015-01-06 ENCOUNTER — Encounter: Payer: Self-pay | Admitting: *Deleted

## 2015-01-06 DIAGNOSIS — Z90710 Acquired absence of both cervix and uterus: Secondary | ICD-10-CM | POA: Insufficient documentation

## 2015-01-06 DIAGNOSIS — I700 Atherosclerosis of aorta: Secondary | ICD-10-CM | POA: Insufficient documentation

## 2015-01-06 DIAGNOSIS — I2510 Atherosclerotic heart disease of native coronary artery without angina pectoris: Secondary | ICD-10-CM | POA: Insufficient documentation

## 2015-01-06 DIAGNOSIS — J439 Emphysema, unspecified: Secondary | ICD-10-CM | POA: Insufficient documentation

## 2015-01-06 DIAGNOSIS — R918 Other nonspecific abnormal finding of lung field: Secondary | ICD-10-CM | POA: Insufficient documentation

## 2015-01-06 DIAGNOSIS — Z9049 Acquired absence of other specified parts of digestive tract: Secondary | ICD-10-CM | POA: Insufficient documentation

## 2015-01-06 DIAGNOSIS — Z8542 Personal history of malignant neoplasm of other parts of uterus: Secondary | ICD-10-CM | POA: Insufficient documentation

## 2015-01-06 LAB — GLUCOSE, CAPILLARY: GLUCOSE-CAPILLARY: 130 mg/dL — AB (ref 65–99)

## 2015-01-06 MED ORDER — FLUDEOXYGLUCOSE F - 18 (FDG) INJECTION
4.9800 | Freq: Once | INTRAVENOUS | Status: AC | PRN
  Administered 2015-01-06: 4.98 via INTRAVENOUS

## 2015-01-10 ENCOUNTER — Ambulatory Visit
Admission: RE | Admit: 2015-01-10 | Discharge: 2015-01-10 | Disposition: A | Discharge status: Home / Self Care | Payer: Self-pay | Source: Ambulatory Visit | Attending: Radiation Oncology | Admitting: Radiation Oncology

## 2015-01-10 ENCOUNTER — Telehealth: Payer: Self-pay | Admitting: Internal Medicine

## 2015-01-10 ENCOUNTER — Other Ambulatory Visit (HOSPITAL_COMMUNITY)
Admission: RE | Admit: 2015-01-10 | Discharge: 2015-01-10 | Disposition: A | Discharge status: Home / Self Care | Payer: Self-pay | Source: Ambulatory Visit | Attending: Internal Medicine | Admitting: Internal Medicine

## 2015-01-10 ENCOUNTER — Encounter: Payer: Self-pay | Admitting: Radiation Oncology

## 2015-01-10 VITALS — BP 154/84 | HR 98 | Resp 16

## 2015-01-10 DIAGNOSIS — C3491 Malignant neoplasm of unspecified part of right bronchus or lung: Secondary | ICD-10-CM | POA: Insufficient documentation

## 2015-01-10 DIAGNOSIS — R222 Localized swelling, mass and lump, trunk: Secondary | ICD-10-CM

## 2015-01-10 MED ORDER — OXYCODONE HCL ER 10 MG PO T12A: 10.0000 mg | EXTENDED_RELEASE_TABLET | Freq: Two times a day (BID) | ORAL | Status: DC

## 2015-01-10 NOTE — Telephone Encounter (Signed)
Returned Advertising account executive. Unable to leave a voicemail.

## 2015-01-10 NOTE — Progress Notes (Signed)
  Radiation Oncology         [phone]) [phone] ________________________________  Name: Laura Vang MRN: [id number]  Date: 01/10/2015  DOB: August 17, 1950  Simulation Verification Note    ICD-9-CM ICD-10-CM   1. Paraspinal mass 781.99 R29.818     Status: outpatient  NARRATIVE: The patient was brought to the treatment unit and placed in the planned treatment position. The clinical setup was verified. Then port films were obtained and uploaded to the radiation oncology medical record software.  The treatment beams were carefully compared against the planned radiation fields. The position location and shape of the radiation fields was reviewed. They targeted volume of tissue appears to be appropriately covered by the radiation beams. Organs at risk appear to be excluded as planned.  Based on my personal review, I approved the simulation verification. The patient's treatment will proceed as planned.  -----------------------------------  Blair Promise, PhD, MD

## 2015-01-10 NOTE — Progress Notes (Signed)
Patient unable to stand for weight. BP slightly elevated. Reports spine pain 10 on a scale of 0-10. Patient requesting additional pain medication. Patient taking Percocet 7.5/325 mg with little relief. Also, patient taking decadron 6 mg qid. Reports numbness in her left leg. Reports she had a soft bowel movement on Sunday.

## 2015-01-10 NOTE — Progress Notes (Signed)
  Radiation Oncology         (336) [phone] ________________________________  Name: Laura Vang MRN: [id number]  Date: 01/10/2015  DOB: 12-Apr-1951  Weekly Radiation Therapy Management  DIAGNOSIS: Probable stage IV lung cancer (pathology pending)  Current Dose: 2.5 Gy     Planned Dose:  35 Gy  Narrative . . . . . . . . The patient presents for routine under treatment assessment.                                     Patient unable to stand for weight due to severe pain. . Reports spine pain 10 on a scale of 0-10. Patient requesting additional pain medication. Patient taking Percocet 7.5/325 mg with little relief. Also, patient taking decadron 6 mg qid. Reports numbness in her left leg. Reports she had a soft bowel movement on Sunday.                                  Set-up films were reviewed.                                 The chart was checked. Physical Findings. . .  blood pressure is 154/84 and her pulse is 98. Her respiration is 16. . Weight essentially stable. The lungs are clear. The heart has a regular rhythm and rate. The abdomen is soft and nontender with normal bowel sounds.  Impression . . . . . . . The patient is tolerating radiation. Plan . . . . . . . . . . . . Continue treatment as planned. A prescription for OxyContin 10 mg twice a day was written for the patient. She will continue on her short acting narcotic. A request for home health referral has also been placed. Final pathology should be out later today.  ________________________________   Blair Promise, PhD, MD

## 2015-01-11 ENCOUNTER — Encounter: Payer: Self-pay | Admitting: Internal Medicine

## 2015-01-11 ENCOUNTER — Other Ambulatory Visit (HOSPITAL_BASED_OUTPATIENT_CLINIC_OR_DEPARTMENT_OTHER): Payer: Self-pay

## 2015-01-11 ENCOUNTER — Ambulatory Visit (HOSPITAL_BASED_OUTPATIENT_CLINIC_OR_DEPARTMENT_OTHER): Payer: Self-pay | Admitting: Internal Medicine

## 2015-01-11 ENCOUNTER — Ambulatory Visit
Admission: RE | Admit: 2015-01-11 | Discharge: 2015-01-11 | Disposition: A | Discharge status: Home / Self Care | Payer: Self-pay | Source: Ambulatory Visit | Attending: Radiation Oncology | Admitting: Radiation Oncology

## 2015-01-11 ENCOUNTER — Telehealth: Payer: Self-pay | Admitting: Internal Medicine

## 2015-01-11 VITALS — BP 142/83 | HR 94 | Temp 98.1°F | Resp 17 | Ht 63.0 in

## 2015-01-11 DIAGNOSIS — C3411 Malignant neoplasm of upper lobe, right bronchus or lung: Secondary | ICD-10-CM

## 2015-01-11 DIAGNOSIS — C3490 Malignant neoplasm of unspecified part of unspecified bronchus or lung: Secondary | ICD-10-CM | POA: Insufficient documentation

## 2015-01-11 DIAGNOSIS — C3491 Malignant neoplasm of unspecified part of right bronchus or lung: Secondary | ICD-10-CM

## 2015-01-11 DIAGNOSIS — R918 Other nonspecific abnormal finding of lung field: Secondary | ICD-10-CM

## 2015-01-11 LAB — CBC WITH DIFFERENTIAL/PLATELET
BASO%: 0.1 % (ref 0.0–2.0)
Basophils Absolute: 0 10*3/uL (ref 0.0–0.1)
EOS ABS: 0.1 10*3/uL (ref 0.0–0.5)
EOS%: 0.6 % (ref 0.0–7.0)
HEMATOCRIT: 39.8 % (ref 34.8–46.6)
HGB: 13.3 g/dL (ref 11.6–15.9)
LYMPH#: 1.7 10*3/uL (ref 0.9–3.3)
LYMPH%: 7.5 % — AB (ref 14.0–49.7)
MCH: 32.1 pg (ref 25.1–34.0)
MCHC: 33.4 g/dL (ref 31.5–36.0)
MCV: 96.1 fL (ref 79.5–101.0)
MONO#: 1.7 10*3/uL — ABNORMAL HIGH (ref 0.1–0.9)
MONO%: 7.3 % (ref 0.0–14.0)
NEUT%: 84.5 % — AB (ref 38.4–76.8)
NEUTROS ABS: 19.1 10*3/uL — AB (ref 1.5–6.5)
Platelets: 867 10*3/uL — ABNORMAL HIGH (ref 145–400)
RBC: 4.14 10*6/uL (ref 3.70–5.45)
RDW: 14.4 % (ref 11.2–14.5)
WBC: 22.6 10*3/uL — ABNORMAL HIGH (ref 3.9–10.3)

## 2015-01-11 LAB — COMPREHENSIVE METABOLIC PANEL (CC13)
ALBUMIN: 2.8 g/dL — AB (ref 3.5–5.0)
ALT: 89 U/L — ABNORMAL HIGH (ref 0–55)
AST: 37 U/L — ABNORMAL HIGH (ref 5–34)
Alkaline Phosphatase: 121 U/L (ref 40–150)
Anion Gap: 11 mEq/L (ref 3–11)
BILIRUBIN TOTAL: 0.38 mg/dL (ref 0.20–1.20)
BUN: 25.8 mg/dL (ref 7.0–26.0)
CALCIUM: 9.3 mg/dL (ref 8.4–10.4)
CO2: 25 mEq/L (ref 22–29)
CREATININE: 0.6 mg/dL (ref 0.6–1.1)
Chloride: 100 mEq/L (ref 98–109)
EGFR: >90 mL/min/{1.73_m2} (ref >90)
GLUCOSE: 114 mg/dL (ref 70–140)
Potassium: 4.5 mEq/L (ref 3.5–5.1)
SODIUM: 136 meq/L (ref 136–145)
Total Protein: 6.7 g/dL (ref 6.4–8.3)

## 2015-01-11 NOTE — Telephone Encounter (Signed)
Called patient and she is aware of her follow up

## 2015-01-11 NOTE — Patient Instructions (Signed)
Smoking Cessation Quitting smoking is important to your health and has many advantages. However, it is not always easy to quit since nicotine is a very addictive drug. Oftentimes, people try 3 times or more before being able to quit. This document explains the best ways for you to prepare to quit smoking. Quitting takes hard work and a lot of effort, but you can do it. ADVANTAGES OF QUITTING SMOKING  You will live longer, feel better, and live better.  Your body will feel the impact of quitting smoking almost immediately.  Within 20 minutes, blood pressure decreases. Your pulse returns to its normal level.  After 8 hours, carbon monoxide levels in the blood return to normal. Your oxygen level increases.  After 24 hours, the chance of having a heart attack starts to decrease. Your breath, hair, and body stop smelling like smoke.  After 48 hours, damaged nerve endings begin to recover. Your sense of taste and smell improve.  After 72 hours, the body is virtually free of nicotine. Your bronchial tubes relax and breathing becomes easier.  After 2 to 12 weeks, lungs can hold more air. Exercise becomes easier and circulation improves.  The risk of having a heart attack, stroke, cancer, or lung disease is greatly reduced.  After 1 year, the risk of coronary heart disease is cut in half.  After 5 years, the risk of stroke falls to the same as a nonsmoker.  After 10 years, the risk of lung cancer is cut in half and the risk of other cancers decreases significantly.  After 15 years, the risk of coronary heart disease drops, usually to the level of a nonsmoker.  If you are pregnant, quitting smoking will improve your chances of having a healthy baby.  The people you live with, especially any children, will be healthier.  You will have extra money to spend on things other than cigarettes. QUESTIONS TO THINK ABOUT BEFORE ATTEMPTING TO QUIT You may want to talk about your answers with your  health care provider.  Why do you want to quit?  If you tried to quit in the past, what helped and what did not?  What will be the most difficult situations for you after you quit? How will you plan to handle them?  Who can help you through the tough times? Your family? Friends? A health care provider?  What pleasures do you get from smoking? What ways can you still get pleasure if you quit? Here are some questions to ask your health care provider:  How can you help me to be successful at quitting?  What medicine do you think would be best for me and how should I take it?  What should I do if I need more help?  What is smoking withdrawal like? How can I get information on withdrawal? GET READY  Set a quit date.  Change your environment by getting rid of all cigarettes, ashtrays, matches, and lighters in your home, car, or work. Do not let people smoke in your home.  Review your past attempts to quit. Think about what worked and what did not. GET SUPPORT AND ENCOURAGEMENT You have a better chance of being successful if you have help. You can get support in many ways.  Tell your family, friends, and coworkers that you are going to quit and need their support. Ask them not to smoke around you.  Get individual, group, or telephone counseling and support. Programs are available at local hospitals and health centers. Call   your local health department for information about programs in your area.  Spiritual beliefs and practices may help some smokers quit.  Download a "quit meter" on your computer to keep track of quit statistics, such as how long you have gone without smoking, cigarettes not smoked, and money saved.  Get a self-help book about quitting smoking and staying off tobacco. LEARN NEW SKILLS AND BEHAVIORS  Distract yourself from urges to smoke. Talk to someone, go for a walk, or occupy your time with a task.  Change your normal routine. Take a different route to work.  Drink tea instead of coffee. Eat breakfast in a different place.  Reduce your stress. Take a hot bath, exercise, or read a book.  Plan something enjoyable to do every day. Reward yourself for not smoking.  Explore interactive web-based programs that specialize in helping you quit. GET MEDICINE AND USE IT CORRECTLY Medicines can help you stop smoking and decrease the urge to smoke. Combining medicine with the above behavioral methods and support can greatly increase your chances of successfully quitting smoking.  Nicotine replacement therapy helps deliver nicotine to your body without the negative effects and risks of smoking. Nicotine replacement therapy includes nicotine gum, lozenges, inhalers, nasal sprays, and skin patches. Some may be available over-the-counter and others require a prescription.  Antidepressant medicine helps people abstain from smoking, but how this works is unknown. This medicine is available by prescription.  Nicotinic receptor partial agonist medicine simulates the effect of nicotine in your brain. This medicine is available by prescription. Ask your health care provider for advice about which medicines to use and how to use them based on your health history. Your health care provider will tell you what side effects to look out for if you choose to be on a medicine or therapy. Carefully read the information on the package. Do not use any other product containing nicotine while using a nicotine replacement product.  RELAPSE OR DIFFICULT SITUATIONS Most relapses occur within the first 3 months after quitting. Do not be discouraged if you start smoking again. Remember, most people try several times before finally quitting. You may have symptoms of withdrawal because your body is used to nicotine. You may crave cigarettes, be irritable, feel very hungry, cough often, get headaches, or have difficulty concentrating. The withdrawal symptoms are only temporary. They are strongest  when you first quit, but they will go away within 10-14 days. To reduce the chances of relapse, try to:  Avoid drinking alcohol. Drinking lowers your chances of successfully quitting.  Reduce the amount of caffeine you consume. Once you quit smoking, the amount of caffeine in your body increases and can give you symptoms, such as a rapid heartbeat, sweating, and anxiety.  Avoid smokers because they can make you want to smoke.  Do not let weight gain distract you. Many smokers will gain weight when they quit, usually less than 10 pounds. Eat a healthy diet and stay active. You can always lose the weight gained after you quit.  Find ways to improve your mood other than smoking. FOR MORE INFORMATION  www.smokefree.gov  Document Released: 07/23/2001 Document Revised: 12/13/2013 Document Reviewed: 11/07/2011 [employer] Patient Information 2015 [employer]. This information is not intended to replace advice given to you by your health care provider. Make sure you discuss any questions you have with your health care provider.  

## 2015-01-11 NOTE — Progress Notes (Signed)
[place] [phone]   [phone]  OFFICE PROGRESS NOTE  Kathlen Brunswick, PA-C [place] [address]  DIAGNOSIS: Stage IV (T3, N0, M1b) non-small cell lung cancer, squamous cell carcinoma diagnosed in May 2016.  PRIOR THERAPY:   CURRENT THERAPY:The patient is currently undergoing palliative radiotherapy to the left paraspinal mass under the care of Dr. Sondra Come.  INTERVAL HISTORY: Laura Vang 64 y.o. female returns to the clinic today for follow-up visit accompanied by her husband, son and granddaughter. The patient Laura Vang started her palliative radiotherapy under the care of Dr. Sondra Come status post 2 fractions. She has not noticed a significant improvement in her back pain yet. She is currently on OxyContin 10 mg by mouth every 12 hours in addition to [place] for breakthrough pain. She had several studies performed recently including a PET scan as well as CT-guided biopsy of the left paraspinal mass and the final pathology was consistent with squamous cell carcinoma. The patient denied having any significant weight loss or night sweats. She has no nausea or vomiting. She denied having any significant chest pain, shortness breath, cough or hemoptysis. She is here today for evaluation and discussion of her biopsy and PET scan results as well as recommendation regarding treatment of her condition.  MEDICAL HISTORY: Past Medical History  Diagnosis Date  . Uterine cancer   . Anxiety   . Lupus     ALLERGIES:  has No Known Allergies.  MEDICATIONS:  Current Outpatient Prescriptions  Medication Sig Dispense Refill  . dexamethasone (DECADRON) 6 MG tablet Take 1 tablet (6 mg total) by mouth 4 (four) times daily. 30 tablet 1  . HYDROcodone-acetaminophen (NORCO) 7.5-325 MG per tablet Take 1 tablet by mouth every 6 (six) hours as needed for moderate pain. 30 tablet 0  . LORazepam (ATIVAN) 0.5 MG tablet Take 1 tablet (0.5 mg total) by  mouth every 8 (eight) hours. 30 tablet 0  . OxyCODONE (OXYCONTIN) 10 mg T12A 12 hr tablet Take 1 tablet (10 mg total) by mouth every 12 (twelve) hours. 30 tablet 0  . docusate sodium (COLACE) 100 MG capsule Take 1 capsule (100 mg total) by mouth 2 (two) times daily. (Patient not taking: Reported on 01/11/2015) 10 capsule 0  . polyethylene glycol powder (GLYCOLAX/MIRALAX) powder Take 17 g by mouth 2 (two) times daily as needed. (Patient not taking: Reported on 01/11/2015) 3350 g 1   No current facility-administered medications for this visit.    SURGICAL HISTORY:  Past Surgical History  Procedure Laterality Date  . Appendectomy    . Cholecystectomy    . Abdominal hysterectomy      REVIEW OF SYSTEMS:  Constitutional: positive for fatigue Eyes: negative Ears, nose, mouth, throat, and face: negative Respiratory: negative Cardiovascular: negative Gastrointestinal: negative Genitourinary:negative Integument/breast: negative Hematologic/lymphatic: negative Musculoskeletal:positive for back pain Neurological: negative Behavioral/Psych: negative Endocrine: negative Allergic/Immunologic: negative   PHYSICAL EXAMINATION: General appearance: alert, cooperative, fatigued and no distress Head: Normocephalic, without obvious abnormality, atraumatic Neck: no adenopathy, no JVD, supple, symmetrical, trachea midline and thyroid not enlarged, symmetric, no tenderness/mass/nodules Lymph nodes: Cervical, supraclavicular, and axillary nodes normal. Resp: clear to auscultation bilaterally Back: symmetric, no curvature. ROM normal. No CVA tenderness. Cardio: regular rate and rhythm, S1, S2 normal, no murmur, click, rub or gallop GI: soft, non-tender; bowel sounds normal; no masses,  no organomegaly Extremities: extremities normal, atraumatic, no cyanosis or edema Neurologic: Alert and oriented X 3, normal strength and tone. Normal symmetric reflexes.  Normal coordination and gait  ECOG PERFORMANCE  STATUS: 2 - Symptomatic, <50% confined to bed  Blood pressure 142/83, pulse 94, temperature 98.1 F (36.7 C), temperature source Oral, resp. rate 17, height '5\' 3"'$  (1.6 m), SpO2 95 %.  LABORATORY DATA: Lab Results  Component Value Date   WBC 22.6* 01/11/2015   HGB 13.3 01/11/2015   HCT 39.8 01/11/2015   MCV 96.1 01/11/2015   PLT 867* 01/11/2015      Chemistry      Component Value Date/Time   NA 136 01/11/2015 1106   NA 137 12/25/2014 0941   K 4.5 01/11/2015 1106   K 3.7 12/25/2014 0941   CL 97 12/25/2014 0941   CO2 25 01/11/2015 1106   CO2 29 12/25/2014 0941   BUN 25.8 01/11/2015 1106   BUN 11 12/25/2014 0941   CREATININE 0.6 01/11/2015 1106   CREATININE 0.50 12/25/2014 0941      Component Value Date/Time   CALCIUM 9.3 01/11/2015 1106   CALCIUM 8.9 12/25/2014 0941   ALKPHOS 121 01/11/2015 1106   ALKPHOS 90 12/25/2014 0941   AST 37* 01/11/2015 1106   AST 8 12/25/2014 0941   ALT 89* 01/11/2015 1106   ALT <8 12/25/2014 0941   BILITOT 0.38 01/11/2015 1106   BILITOT 0.3 12/25/2014 0941       RADIOGRAPHIC STUDIES: Ct Chest Wo Contrast  12/25/2014   CLINICAL DATA:  Lung mass seen on radiograph.  EXAM: CT CHEST WITHOUT CONTRAST  TECHNIQUE: Multidetector CT imaging of the chest was performed following the standard protocol without IV contrast.  COMPARISON:  None.  FINDINGS: No pneumothorax or pleural effusion is noted. Probable scarring is seen posteriorly in the left lung base. However, large multilobulated mass is seen posteriorly in the right upper lobe which is noted to extend across the major fissure into the superior segment of the right lower lobe. It measures 6.5 x 4.9 x 4.7 cm. Ill-defined nodular density measuring 6 mm is noted posteriorly in the right middle lobe. Atherosclerosis of thoracic aorta is noted without aneurysm formation. No definite evidence of mediastinal mass or adenopathy is seen on these unenhanced images. Probable left adrenal adenoma is noted. There  is the suggestion of a large left perispinal soft tissue mass seen on the inferior portion of the exam concerning for malignancy or metastatic disease. Further evaluation with CT scan of the abdomen and pelvis is recommended.  IMPRESSION: 6.5 x 4.9 x 4.7 cm mass is noted predominantly in the posterior portion of the right upper lobe which extends across the major fissure into the superior segment of the right lower lobe. This is consistent with malignancy.  6 mm nodule is noted in the right middle lobe of unknown etiology, but metastatic disease cannot be excluded.  Probable large left paraspinal mass is seen at the level of the left kidney highly concerning for malignancy or metastatic disease. CT scan of the abdomen with intravenous contrast administration is recommended for further evaluation.  These results will be called to the ordering clinician or representative by the Radiologist Assistant, and communication documented in the PACS or zVision Dashboard.   Electronically Signed   By: Marijo Conception, M.D.   On: 12/25/2014 13:40   Mr Lumbar Spine Wo Contrast  12/24/2014   CLINICAL DATA:  LEFT-sided low back pain attributed to a lifting injury. Remote history of ovarian cancer.  EXAM: MRI LUMBAR SPINE WITHOUT CONTRAST  TECHNIQUE: Multiplanar, multisequence MR imaging of the lumbar spine was performed. No  intravenous contrast was administered.  COMPARISON:  None.  FINDINGS: There is a large LEFT paravertebral mass centered at the L2 level. The majority of the mass is extraosseous, measuring 63 x 56 by 77 mm. The mass appears predominantly solid, T2 hypointense with areas of central T2 hyperintensity which could represent necrosis. The mass is not clearly encapsulated. It is adjacent to, of but does not clearly arise from, the LEFT L2 nerve root. The L2 pedicle is spared but there is L2 osseous involvement as seen on sagittal image 9 series 5 for instance. Involvement of the posterior elements on the LEFT at L2  is also observed. Abnormal bone marrow edema pervades the L2 vertebral body which could represent a combination of infiltrative tumor or reactive bone marrow edema. There is slight epidural tumor seen best on coronal T2 weighted images, but no significant stenosis. LEFT-sided foraminal narrowing at this level likely contributes to LEFT L2 nerve root irritation.  Ordinary spondylosis is seen at the L2-3, L3-4, L4-5, and L5-S1 levels. Conus somewhat low, but not compressed, ending at the L2-3 disc space.  Correlating with prior plain films, the pedicle of L2 on the LEFT is spared.  IMPRESSION: Large LEFT paravertebral mass centered at the L2 vertebral body level. Minor epidural tumor without significant spinal stenosis. I believe this represents a neoplasm but it is unclear whether it is benign or malignant, and from what tissue group it arises. Considerations would include schwannoma, soft tissue sarcoma, or primary bone tremor. With regard to hematopoetic tumors, lymphoma and plasmacytoma would be most likely. Soft tissue metastasis from lung, or breast cancer, less likely ovarian are additional considerations. Post infusion imaging, along with tissue sampling, are warranted.  These results will be called to the ordering clinician or representative by the Radiologist Assistant, and communication documented in the PACS or zVision Dashboard.   Electronically Signed   By: Rolla Flatten M.D.   On: 12/24/2014 10:04   Nm Pet Image Initial (pi) Skull Base To Thigh  01/06/2015   CLINICAL DATA:  Initial treatment strategy for lung mass with multiple lesions. Prior history of uterine cancer.  EXAM: NUCLEAR MEDICINE PET SKULL BASE TO THIGH  TECHNIQUE: 4.98 mCi F-18 FDG was injected intravenously. Full-ring PET imaging was performed from the skull base to thigh after the radiotracer. CT data was obtained and used for attenuation correction and anatomic localization.  FASTING BLOOD GLUCOSE:  Value: 130 mg/dl  COMPARISON:  CT  chest dated 12/25/2014. MR lumbar spine dated 12/23/2014.  FINDINGS: NECK  No hypermetabolic lymph nodes in the neck.  CHEST  6.8 x 4.6 cm mass centered in the posterior/medial right upper lobe and extending into the superior segment right lower lobe (series 6/image 14), centrally necrotic, max SUV 12.6.  Nodular scarring in the left lower lobe (series 6/ image 48).  5 mm right middle lobe nodule (series 6/ image 30). 2-3 mm nodules in the bilateral upper lobes (series 6/images 13-14). These findings remain non FDG avid although beneath the size threshold for PET sensitivity.  Mild emphysematous changes.  No pleural effusion or pneumothorax.  The heart is normal in size.  No pericardial effusion.  Coronary atherosclerosis. Atherosclerotic calcifications of the aortic arch.  No hypermetabolic thoracic lymphadenopathy.  ABDOMEN/PELVIS  No abnormal hypermetabolic activity within the liver, pancreas, adrenal glands, or spleen.  No hypermetabolic lymph nodes in the abdomen or pelvis.  Status post cholecystectomy. Atherosclerotic calcifications of the abdominal aorta and branch vessels.  Status post hysterectomy.  SKELETON  6.8 x 5.4 cm hypermetabolic left paraspinal mass along the left L2 vertebral body (series 4/ image 121), centrally necrotic, max SUV 9.0. Soft tissue gas within the lesion related to recent intervention (series 4/ image 117). Associated osseous destruction of the left transverse process and left pedicle (series 4/image 115).  IMPRESSION: 6.8 cm mass centered in the posterior/medial right upper lobe and extending into the superior segment right lower lobe, suspicious for primary bronchogenic neoplasm versus metastasis.  6.8 cm left paraspinal mass with associated osseous destruction and mild epidural extension on recent MRI, suspicious for metastasis or less likely primary nerve sheath tumor. Correlate with pending pathology results.  Otherwise, no evidence of metastatic disease.   Electronically Signed    By: Julian Hy M.D.   On: 01/06/2015 12:09   Ct Biopsy  01/04/2015   CLINICAL DATA:  7 cm right upper lobe lung mass and destructive 7 cm mass in the left lumbar paraspinal soft tissues causing bony destruction at the L2 level. The patient presents for biopsy of the lumbar paraspinal soft tissue mass.  EXAM: CT GUIDED CORE BIOPSY OF LUMBAR PARASPINAL SOFT TISSUE MASS  ANESTHESIA/SEDATION: 1.0  Mg IV Versed; 50 mcg IV Fentanyl  Total Moderate Sedation Time: 12 minutes.  PROCEDURE: The procedure risks, benefits, and alternatives were explained to the patient. Questions regarding the procedure were encouraged and answered. The patient understands and consents to the procedure.  The left trans lumbar region was prepped with Betadine in a sterile fashion, and a sterile drape was applied covering the operative field. A sterile gown and sterile gloves were used for the procedure. Local anesthesia was provided with 1% Lidocaine.  CT was performed in a prone position. Under CT guidance, a 17 gauge needle was advanced into the left-sided paraspinal soft tissues at the L2 level. After confirming needle tip position, coaxial core biopsy samples were obtained with an 18 gauge device. Four core samples were submitted in formalin. Additional imaging was performed.  COMPLICATIONS: None  FINDINGS: Large destructive soft tissue mass causes bony destruction of the left L2 vertebral body and extends into the posterior paraspinous soft tissues. Solid tissue was obtained from the lesion.  IMPRESSION: CT-guided core biopsy performed of the destructive left lumbar paraspinous soft tissue mass centered at the L2 level. Solid tissue was obtained.   Electronically Signed   By: Aletta Edouard M.D.   On: 01/04/2015 14:15   Dg Abd Acute W/chest  12/25/2014   CLINICAL DATA:  Abdominal pain and 15 lb weight loss.  EXAM: DG ABDOMEN ACUTE W/ 1V CHEST  COMPARISON:  None.  FINDINGS: There is no evidence of dilated bowel loops or free  intraperitoneal air. Marked bowel content is identified throughout colon. Prior cholecystectomy clips are identified. There is scoliosis of spine. No radiopaque calculi or other significant radiographic abnormality is seen. Heart size and mediastinal contours are within normal limits. There is a 5.9 x 7.5 cm mass in the medial right upper lobe.  IMPRESSION: Negative abdominal radiographs.  Constipation.  Large mass in the right upper lobe. Further evaluation with a chest CT is recommended.   Electronically Signed   By: Abelardo Diesel M.D.   On: 12/25/2014 10:27    ASSESSMENT AND PLAN: This is a very pleasant 64 years old white female recently diagnosed with stage IV non-small cell lung cancer, squamous cell carcinoma presented with large right upper lobe lung mass in addition to left paraspinal destructive soft tissue lesion that was biopsy proven to be  squamous cell carcinoma. I had a lengthy discussion with the patient and her family today about her current condition and treatment options. The patient is scheduled for MRI of the brain early next week. I recommended for the patient to continue with her current palliative radiotherapy to the left paraspinal mass under the care of Dr. Sondra Come as a scheduled. I also discussed with the patient and her other treatment options including palliative care and hospice referral versus consideration of systemic chemotherapy with either carboplatin and paclitaxel or carboplatin and gemcitabine. The patient is interested in systemic chemotherapy and this will be discussed with her any more details after completion of the course of palliative radiotherapy to the left paraspinal lesion. I will arrange for the patient a follow-up appointment with me in 3 weeks for reevaluation and discussion of her treatment options in more details. For the back pain, the patient will continue her treatment with palliative radiotherapy as well as the current pain medication. She was  advised to call immediately if she has any concerning symptoms in the interval. The patient voices understanding of current disease status and treatment options and is in agreement with the current care plan.  All questions were answered. The patient knows to call the clinic with any problems, questions or concerns. We can certainly see the patient much sooner if necessary.  I spent 15 minutes counseling the patient face to face. The total time spent in the appointment was 25 minutes.  Disclaimer: This note was dictated with voice recognition software. Similar sounding words can inadvertently be transcribed and may not be corrected upon review.

## 2015-01-12 ENCOUNTER — Ambulatory Visit
Admission: RE | Admit: 2015-01-12 | Discharge: 2015-01-12 | Disposition: A | Discharge status: Home / Self Care | Payer: Self-pay | Source: Ambulatory Visit | Attending: Radiation Oncology | Admitting: Radiation Oncology

## 2015-01-13 ENCOUNTER — Ambulatory Visit
Admission: RE | Admit: 2015-01-13 | Discharge: 2015-01-13 | Disposition: A | Discharge status: Home / Self Care | Payer: Self-pay | Source: Ambulatory Visit | Attending: Radiation Oncology | Admitting: Radiation Oncology

## 2015-01-13 ENCOUNTER — Other Ambulatory Visit: Payer: Self-pay | Admitting: Radiation Oncology

## 2015-01-13 DIAGNOSIS — R918 Other nonspecific abnormal finding of lung field: Secondary | ICD-10-CM

## 2015-01-13 DIAGNOSIS — R222 Localized swelling, mass and lump, trunk: Secondary | ICD-10-CM

## 2015-01-13 MED ORDER — RADIAPLEXRX EX GEL: Freq: Once | CUTANEOUS | Status: DC

## 2015-01-13 MED ORDER — LORAZEPAM 0.5 MG PO TABS: 0.5000 mg | ORAL_TABLET | Freq: Three times a day (TID) | ORAL | Status: AC | PRN

## 2015-01-13 MED ORDER — HYDROCODONE-ACETAMINOPHEN 7.5-325 MG PO TABS: 1.0000 | ORAL_TABLET | Freq: Four times a day (QID) | ORAL | Status: DC | PRN

## 2015-01-13 MED ORDER — RADIAPLEXRX EX GEL
Freq: Once | CUTANEOUS | Status: DC
Start: 2015-01-13 — End: 2015-01-13

## 2015-01-13 NOTE — Progress Notes (Signed)
Pt here for patient teaching.  Pt given Radiation and You booklet, skin care instructions and Radiaplex gel. Reviewed areas of pertinence such as diarrhea, fatigue, hair loss, nausea and vomiting, skin changes, throat changes and shortness of breath . Pt able to give teach back of to pat skin and drink plenty of water,apply Radiaplex bid and avoid applying anything to skin within 4 hours of treatment. Pt demonstrated understanding and needs reinforcement of information given and will contact nursing with any questions or concerns. Informed her that she may also experience irritation of her esophagus during treatment and educated to chew well before swallowing, eat softer textured foods with gravy or sauces as needed.  Also advised to drink liquids at room temperature if cold or hot liquids cause esophageal discomfort.  She and her family stated understanding.   Gave this RN's business card to her spouse and daughter.  [name] accompanied by her family.  She appears very fragile and fatigued today.  Travel by wheelchair.

## 2015-01-13 NOTE — Addendum Note (Signed)
Encounter addended by: Benn Moulder, RN on: 01/13/2015  3:32 PM<BR>     Documentation filed: Orders, [employer]

## 2015-01-13 NOTE — Addendum Note (Signed)
Encounter addended by: Benn Moulder, RN on: 01/13/2015  3:34 PM<BR>     Documentation filed: [employer], Inpatient Patient Education, Orders

## 2015-01-16 ENCOUNTER — Ambulatory Visit
Admission: RE | Admit: 2015-01-16 | Discharge: 2015-01-16 | Disposition: A | Discharge status: Home / Self Care | Payer: Self-pay | Source: Ambulatory Visit | Attending: Radiation Oncology | Admitting: Radiation Oncology

## 2015-01-16 ENCOUNTER — Encounter (HOSPITAL_COMMUNITY): Payer: Self-pay

## 2015-01-17 ENCOUNTER — Ambulatory Visit (HOSPITAL_COMMUNITY)
Admission: RE | Admit: 2015-01-17 | Discharge: 2015-01-17 | Disposition: A | Discharge status: Home / Self Care | Payer: Self-pay | Source: Ambulatory Visit | Attending: Internal Medicine | Admitting: Internal Medicine

## 2015-01-17 ENCOUNTER — Ambulatory Visit
Admission: RE | Admit: 2015-01-17 | Discharge: 2015-01-17 | Disposition: A | Discharge status: Home / Self Care | Payer: Self-pay | Source: Ambulatory Visit | Attending: Radiation Oncology | Admitting: Radiation Oncology

## 2015-01-17 ENCOUNTER — Encounter: Payer: Self-pay | Admitting: Radiation Oncology

## 2015-01-17 VITALS — BP 156/84 | HR 71 | Temp 98.3°F | Resp 16 | Ht 63.0 in | Wt 97.5 lb

## 2015-01-17 DIAGNOSIS — R262 Difficulty in walking, not elsewhere classified: Secondary | ICD-10-CM | POA: Insufficient documentation

## 2015-01-17 DIAGNOSIS — M2548 Effusion, other site: Secondary | ICD-10-CM | POA: Insufficient documentation

## 2015-01-17 DIAGNOSIS — C3490 Malignant neoplasm of unspecified part of unspecified bronchus or lung: Secondary | ICD-10-CM

## 2015-01-17 DIAGNOSIS — G939 Disorder of brain, unspecified: Secondary | ICD-10-CM | POA: Insufficient documentation

## 2015-01-17 DIAGNOSIS — R918 Other nonspecific abnormal finding of lung field: Secondary | ICD-10-CM

## 2015-01-17 DIAGNOSIS — C3411 Malignant neoplasm of upper lobe, right bronchus or lung: Secondary | ICD-10-CM | POA: Insufficient documentation

## 2015-01-17 DIAGNOSIS — R200 Anesthesia of skin: Secondary | ICD-10-CM | POA: Insufficient documentation

## 2015-01-17 DIAGNOSIS — Z8673 Personal history of transient ischemic attack (TIA), and cerebral infarction without residual deficits: Secondary | ICD-10-CM | POA: Insufficient documentation

## 2015-01-17 MED ORDER — GADOBENATE DIMEGLUMINE 529 MG/ML IV SOLN
9.0000 mL | Freq: Once | INTRAVENOUS | Status: AC | PRN
  Administered 2015-01-17: 9 mL via INTRAVENOUS

## 2015-01-17 NOTE — Progress Notes (Signed)
  Radiation Oncology         [phone] ________________________________  Name: Laura Vang MRN: [id number]  Date: 01/17/2015  DOB: [birth year]  Weekly Radiation Therapy Management  DIAGNOSIS: Stage IV (T3, N0, M1b) non-small cell lung cancer, squamous cell carcinoma diagnosed in May 2016.  CURRENT THERAPY:The patient is currently undergoing palliative radiotherapy to the left paraspinal mass   Current Dose: 15 Gy     Planned Dose:  35 Gy  Narrative . . . . . . . . The patient presents for routine under treatment assessment.                                   She reports that her pain is better but rates it at a 8/10 in her lower back. She is taking oxycodone 10 mg bid and hydrocodone/acetaminophen q 6 hours in between. She denies nausea and diarrhea. She reports a good appetite. She has lost 3 lbs since 01/04/15. She is taking decadron 6 mg q 6 hours. She reports her left thigh gets cold and numb which improves with activity. She is using a wheelchair today and is unsteady when ambulating. She does have several scabbed, red sores in her lower, middle back. She has been putting neosporin on them. She said they appeared a week ago.                                 Set-up films were reviewed.                                 The chart was checked. Physical Findings. . .  height is '5\' 3"'$  (1.6 m) and weight is 97 lb 8 oz (44.226 kg). Her oral temperature is 98.3 F (36.8 C). Her blood pressure is 156/84 and her pulse is 71. Her respiration is 16 and oxygen saturation is 99%. . The lungs are clear. The heart has a regular rhythm and rate. The abdomen is soft and nontender with normal bowel sounds. Examination of the lower back reveals some scabbing along the right midline. Unsure if this is early shingles or related mattress/sheet irritation from lying recumbent Impression . . . . . . . The patient is tolerating radiation. Plan . . . . . . . . . . . . Continue treatment as planned. She will be  reexamined later this week to see if she may be developing shingles. The area of concern is not particularly uncomfortable or causing a burning sensation at this time  ________________________________   Blair Promise, PhD, MD

## 2015-01-17 NOTE — Progress Notes (Signed)
Spoke to [name], spouse, about having made a nutritionist appointment for Friday at [place]. He was agreeable.

## 2015-01-17 NOTE — Progress Notes (Addendum)
Laura Vang has completed 6 fractions to her lumbar spine.  She reports that her pain is better but rates it at a 8/10 in her lower back.  She is taking oxycodone 10 mg bid and hydrocodone/acetaminophen q 6 hours in between.  She denies nausea and diarrhea.  She reports a good appetite.  She has lost 3 lbs since 01/04/15. She is taking decadron 6 mg q 6 hours.  She reports her left thigh gets cold and numb which improves with activity.  She is using a wheelchair today and is unsteady when ambulating.  She does have several scabbed, red sores in her lower, middle back.  She has been putting neosporin on them.  She said they appeared a week ago.  BP 156/84 mmHg  Pulse 71  Temp(Src) 98.3 F (36.8 C) (Oral)  Resp 16  SpO2 99%

## 2015-01-18 ENCOUNTER — Ambulatory Visit
Admission: RE | Admit: 2015-01-18 | Discharge: 2015-01-18 | Disposition: A | Discharge status: Home / Self Care | Payer: Self-pay | Source: Ambulatory Visit | Attending: Radiation Oncology | Admitting: Radiation Oncology

## 2015-01-18 ENCOUNTER — Encounter: Payer: Self-pay | Admitting: Skilled Nursing Facility1

## 2015-01-18 DIAGNOSIS — C3490 Malignant neoplasm of unspecified part of unspecified bronchus or lung: Secondary | ICD-10-CM

## 2015-01-18 NOTE — Progress Notes (Signed)
Subjective:     Patient ID: Laura Vang, female   DOB: [birth year], 64 y.o.   MRN: [id number]  HPI   Review of Systems     Objective:   Physical Exam To assist the pt in identifying dietary strategies to gain some lost wt back.    Assessment:     Pt was identified as being malnourished due to some lost wt. Pt was contacted via the telephone at [phone]. Pt was found to be in good spirits and states she has lost some weight but it does not concern her. Pt states her appetite is great.     Plan:     Dietitian encouraged the pt to call if she had any questions or her appetite declined.

## 2015-01-18 NOTE — Progress Notes (Signed)
Provided patient with micromedex educational material about decubitus prevention. Reviewed material and educated family and patient reference decubitus prevention. Family and patient verbalized understanding of all reviewed. Patient seen by Dr. Sondra Come.

## 2015-01-18 NOTE — Progress Notes (Signed)
  Radiation Oncology         [phone] ________________________________  Name: Jobina Maita MRN: [id number]  Date: 01/18/2015  DOB: October 23, 1950  Weekly Radiation Therapy Management  DIAGNOSIS: Stage IV (T3, N0, M1b) non-small cell lung cancer, squamous cell carcinoma diagnosed in May 2016.  Current Dose: 17.5 Gy     Planned Dose:  35 Gy  Narrative . . . . . . . . The patient presents for routine under treatment assessment.                                   The patient seen again today concerning her skin in the lower back area                                 Set-up films were reviewed.                                 The chart was checked. Physical Findings. . . Patient has several small scabbed red sores in the lower back. These appear unchanged and possibly better compared to yesterday. Impression . . . . . . . The patient is tolerating radiation. Plan . . . . . . . . . . . . Continue treatment as planned. Doubt these areas or shingles since the patient does not have any  symptoms related to these areas. Continue to be monitored. She at this time we'll continue using Neosporin. Nursing is also given the patient information about bed sore management in the event that these are developing bedsores.  ________________________________   Blair Promise, PhD, MD

## 2015-01-19 ENCOUNTER — Encounter: Payer: Self-pay | Admitting: Radiation Oncology

## 2015-01-19 ENCOUNTER — Ambulatory Visit: Payer: Self-pay | Admitting: Radiation Oncology

## 2015-01-19 ENCOUNTER — Ambulatory Visit
Admission: RE | Admit: 2015-01-19 | Discharge: 2015-01-19 | Disposition: A | Discharge status: Home / Self Care | Payer: Self-pay | Source: Ambulatory Visit | Attending: Radiation Oncology | Admitting: Radiation Oncology

## 2015-01-19 DIAGNOSIS — C3490 Malignant neoplasm of unspecified part of unspecified bronchus or lung: Secondary | ICD-10-CM

## 2015-01-19 NOTE — Progress Notes (Signed)
  Radiation Oncology         (336) [phone] ________________________________  Name: Laura Vang MRN: [id number]  Date: 01/19/2015  DOB: Jul 29, 1951  Weekly Radiation Therapy Management  DIAGNOSIS: Stage IV (T3, N0, M1b) non-small cell lung cancer, squamous cell carcinoma diagnosed in May 2016.  Current Dose: 20.0 Gy     Planned Dose:  35 Gy  Narrative . . . . . . . . The patient presents for routine under treatment assessment.                                   The patient seen again today concerning her skin in the lower back area                                 Set-up films were reviewed.                                 The chart was checked. Physical Findings. . . Patient has several small scabbed red sores in the lower back. These lesions appear to be unchanged and possibly better compared to her last visit. Impression . . . . . . . The patient is tolerating radiation. Plan . . . . . . . . . . . . Continue treatment as planned. She will place desitin cream on the area twice a day.  This document serves as a record of services personally performed by Gery Pray, MD. It was created on his behalf by Darcus Austin, a trained medical scribe. The creation of this record is based on the scribe's personal observations and the provider's statements to them. This document has been checked and approved by the attending provider. ________________________________   Blair Promise, PhD, MD

## 2015-01-19 NOTE — Progress Notes (Signed)
Laura Vang has completed 8 fractions to her lumbar spine.  She reports pain at a 9/10 in her lower back.  She continues to take her long acting oxycontin and vicodin in between.  She continues to take decadron.  The are on her middle lower back appears smaller.  There are several scabbed sores.  She is using neosporin once a day.  BP 132/81 mmHg  Pulse 110  Temp(Src) 98.3 F (36.8 C) (Oral)  Resp 16  SpO2 97%

## 2015-01-20 ENCOUNTER — Other Ambulatory Visit: Payer: Self-pay | Admitting: *Deleted

## 2015-01-20 ENCOUNTER — Ambulatory Visit: Payer: Self-pay | Admitting: Nutrition

## 2015-01-20 ENCOUNTER — Other Ambulatory Visit: Payer: Self-pay | Admitting: Radiation Therapy

## 2015-01-20 ENCOUNTER — Ambulatory Visit
Admission: RE | Admit: 2015-01-20 | Discharge: 2015-01-20 | Disposition: A | Discharge status: Home / Self Care | Payer: Self-pay | Source: Ambulatory Visit | Attending: Radiation Oncology | Admitting: Radiation Oncology

## 2015-01-20 DIAGNOSIS — C7931 Secondary malignant neoplasm of brain: Secondary | ICD-10-CM

## 2015-01-20 MED ORDER — DEXAMETHASONE 4 MG PO TABS: 4.0000 mg | ORAL_TABLET | Freq: Three times a day (TID) | ORAL | Status: AC

## 2015-01-20 NOTE — Progress Notes (Signed)
64 year old female diagnosed with stage IV lung cancer.  She is a patient of Dr. Earlie Server and Dr. Sondra Come.  Past medical history includes uterine cancer, anxiety, and lupus.  Medications include Decadron, Colace, Ativan, MiraLAX.  Labs include albumen 2.8 on June 1.  Height: 63 inches. Weight: 97.5 pounds on June 7. Usual body weight: 110 pounds. BMI: 17.28.  Patient presents to nutrition consult with family. Patient reports now the pain is controlled, she has a wonderful appetite and is eating frequently throughout the day. Denies other nutrition impact symptoms. States she drinks around 5 boost a day. Eating 3 meals and snacking as well.  Nutrition diagnosis: Unintended weight loss related to pain with stage IV lung cancer as evidenced by 10 pound weight loss from usual body weight.  Intervention: Recommended patient continue small frequent meals with adequate calories and protein to promote weight maintenance/weight gain. Provided samples of ensure and boost along with coupons. Provided fact sheet on increasing calories and protein. Recommended patient contact me if she develops nutrition impact symptoms once chemotherapy starts.    Monitoring, evaluation, goals: Patient will tolerate increased calories and protein for weight maintenance/weight gain.  Next visit: To be scheduled as needed.  **Disclaimer: This note was dictated with voice recognition software. Similar sounding words can inadvertently be transcribed and this note may contain transcription errors which may not have been corrected upon publication of note.**

## 2015-01-20 NOTE — Telephone Encounter (Signed)
Pt in RadOnc for appt today, call from [place], [place] pt has 2 tablets Decadron left, requesting refill. Reviewed refill request with MD, VO decrease Decadron to '4mg'$  TID Refill sent to pt pharmacy Notified pt of change

## 2015-01-23 ENCOUNTER — Ambulatory Visit
Admission: RE | Admit: 2015-01-23 | Discharge: 2015-01-23 | Disposition: A | Discharge status: Home / Self Care | Payer: Self-pay | Source: Ambulatory Visit | Attending: Radiation Oncology | Admitting: Radiation Oncology

## 2015-01-24 ENCOUNTER — Ambulatory Visit
Admission: RE | Admit: 2015-01-24 | Discharge: 2015-01-24 | Disposition: A | Discharge status: Home / Self Care | Payer: Self-pay | Source: Ambulatory Visit | Attending: Radiation Oncology | Admitting: Radiation Oncology

## 2015-01-24 ENCOUNTER — Encounter: Payer: Self-pay | Admitting: Radiation Oncology

## 2015-01-24 VITALS — BP 133/99 | HR 90 | Temp 98.2°F | Resp 20 | Wt 95.0 lb

## 2015-01-24 DIAGNOSIS — C3490 Malignant neoplasm of unspecified part of unspecified bronchus or lung: Secondary | ICD-10-CM

## 2015-01-24 MED ORDER — OXYCODONE HCL ER 10 MG PO T12A: 10.0000 mg | EXTENDED_RELEASE_TABLET | Freq: Two times a day (BID) | ORAL | Status: AC

## 2015-01-24 NOTE — Progress Notes (Signed)
Weekly rad txs L-spine  11/14 completed, requested refill on Oxycotin '10mg'$   1 tab every 12 hours, last written 01/10/15,  #30 tabs, patient took her hydrocodone  This am no c/o pain now in her back, no nausea,  Appetite good In w/c,  10:16 AM BP 133/99 mmHg  Pulse 90  Temp(Src) 98.2 F (36.8 C) (Oral)  Resp 20  Wt 95 lb (43.092 kg)  Wt Readings from Last 3 Encounters:  01/24/15 95 lb (43.092 kg)  01/17/15 97 lb 8 oz (44.226 kg)  01/04/15 100 lb (45.36 kg)

## 2015-01-24 NOTE — Progress Notes (Signed)
  Radiation Oncology         [phone] ________________________________  Name: Laura Vang MRN: [id number]  Date: 01/24/2015  DOB: 11-06-1950  Weekly Radiation Therapy Management  DIAGNOSIS: Stage IV (T3, N0, M1b) non-small cell lung cancer, squamous cell carcinoma diagnosed in May 2016.  Current Dose: 27.5 Gy     Planned Dose:  35 Gy  Narrative . . . . . . . . The patient presents for routine under treatment assessment.                                   The patient is without complaint. Her pain is better at this time. She is taking OxyContin 10 mg twice daily in addition to breakthrough pain. I did refill the patient's OxyContin today. She denies any burning or itching in the previous area of rash in the lower back area. She has noticed a rash in the upper back and trunk region this does not bother her.                                  Set-up films were reviewed.                                 The chart was checked. Physical Findings. . .  weight is 95 lb (43.092 kg). Her oral temperature is 98.2 F (36.8 C). Her blood pressure is 133/99 and her pulse is 90. Her respiration is 20. . The rash in the lower back region is better with Neosporin. No obvious signs shingles. The patient has a macular rash throughout much of the upper back, etiology unknown Impression . . . . . . . The patient is tolerating radiation. Plan . . . . . . . . . . . . Continue treatment as planned. I recommended she use Benadryl cream if this other rash causes some itching or discomfort  ________________________________   Blair Promise, PhD, MD

## 2015-01-25 ENCOUNTER — Other Ambulatory Visit: Payer: Self-pay

## 2015-01-25 ENCOUNTER — Ambulatory Visit
Admission: RE | Admit: 2015-01-25 | Discharge: 2015-01-25 | Disposition: A | Discharge status: Home / Self Care | Payer: Self-pay | Source: Ambulatory Visit | Attending: Radiation Oncology | Admitting: Radiation Oncology

## 2015-01-25 ENCOUNTER — Encounter: Payer: Self-pay | Admitting: Radiation Oncology

## 2015-01-25 NOTE — Progress Notes (Signed)
Location/Histology of Brain Tumor: Two enhancing left frontal lobe brain lesions consistent with metastatic non small cell lung ca  Patient presented with symptoms of: found in workup of non small cell lung ca  Past or anticipated interventions, if any, per neurosurgery: no  Past or anticipated interventions, if any, per medical oncology: to discuss systemic chemotherapy with either carboplatin and paclitaxel or carboplatin and gemcitabine following completion of palliative radiotherapy to left paraspinal lesion  Dose of Decadron, if applicable: decadron 4 mg tid  Recent neurologic symptoms, if any:   Seizures: no  Headaches: no  Nausea: no  Dizziness/ataxia: no  Difficulty with hand coordination: no  Focal numbness/weakness: yes  Visual deficits/changes: no  Confusion/Memory deficits: no  Painful bone metastases at present, if any: yes, left paraspinal mass (Presently, receiving palliative radiotherapy to this mass under the care of Dr. Sondra Come)  SAFETY ISSUES:  Prior radiation? yes, currently under treat  Pacemaker/ICD? no  Possible current pregnancy? no  Is the patient on methotrexate? no  Additional Complaints / other details: 64 year old female.   Denies night sweats, nausea, vomiting, chest pain, SOB, cough or hemoptysis. Reports radiotherapy is helping relieve low back pain.

## 2015-01-26 ENCOUNTER — Encounter: Payer: Self-pay | Admitting: Radiation Oncology

## 2015-01-26 ENCOUNTER — Ambulatory Visit
Admission: RE | Admit: 2015-01-26 | Discharge: 2015-01-26 | Disposition: A | Discharge status: Home / Self Care | Payer: Self-pay | Source: Ambulatory Visit | Attending: Radiation Oncology | Admitting: Radiation Oncology

## 2015-01-26 VITALS — BP 118/75 | HR 102 | Temp 97.5°F | Resp 18 | Ht 63.0 in | Wt 95.0 lb

## 2015-01-26 DIAGNOSIS — C7931 Secondary malignant neoplasm of brain: Secondary | ICD-10-CM

## 2015-01-26 NOTE — Progress Notes (Signed)
Weight and vitals stable. Reports low back pain is less since starting radiation to left paraspinal mass. Reports occasional numbness of left upper leg. Reports ability to control bowel and bladder is normal. Reports he had a bowel movement yesterday. Denies headache, seizures, nausea, vomiting, dizziness, or confusion. Denies difficulty with hand coordination, visual changes or ringing of the ears. Taking decadron 4 mg tid. Here for consult with Dr. Tammi Klippel accompanied by her neighbor, son and grandson.

## 2015-01-26 NOTE — Progress Notes (Signed)
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 female who is presented with back pain since February 2016.  An X-ray of the spine done on 11/13/14 no acute abnormalities of the lumbar spine. Subsequent lumbar MRI demonstrated a tumor at L2 with paraspinal mass. Subsequent Chest CT showed a 7.5 mass in right upper lung. CT with biopsy on 01/04/15 showed a squamous cell carcinoma in the lung with a high positive for PD1 suggesting she is a good candidate for immunotherapy. PET scan showed hypermetabolism associated with the a 6.8 cm right upper lung mas and the 6.8 cm lumbar lesion.  MRI showed two brain metastases with 5.7 mm in temporal lobe 2.5 cm left frontal lobe.  PREVIOUS RADIATION THERAPY: YES  PAST MEDICAL HISTORY:  has a past medical history of Uterine cancer; Anxiety; Lupus; and Lung cancer.    PAST SURGICAL HISTORY: Past Surgical History  Procedure Laterality Date  . Appendectomy    . Cholecystectomy    . Abdominal hysterectomy      FAMILY HISTORY: family history includes Cancer in her brother and mother; Heart disease in her father.  SOCIAL HISTORY:  reports that she has been smoking Cigarettes.  She has a 41 pack-year smoking history. She has never used smokeless tobacco. She reports that she drinks about 7.2 oz of alcohol per week. She reports that she uses illicit drugs.  ALLERGIES: Review of patient's allergies indicates no known allergies.  MEDICATIONS:  Current Outpatient Prescriptions  Medication Sig Dispense Refill  . dexamethasone (DECADRON) 4 MG tablet Take 1 tablet (4 mg total)  by mouth 3 (three) times daily. 90 tablet 0  . HYDROcodone-acetaminophen (NORCO) 7.5-325 MG per tablet Take 1 tablet by mouth every 6 (six) hours as needed for moderate pain. 40 tablet 0  . LORazepam (ATIVAN) 0.5 MG tablet Take 1 tablet (0.5 mg total) by mouth every 8 (eight) hours as needed for anxiety. 40 tablet 0  . OxyCODONE (OXYCONTIN) 10 mg T12A 12 hr tablet Take 1 tablet (10 mg total) by mouth every 12 (twelve) hours. 60 tablet 0  . docusate sodium (COLACE) 100 MG capsule Take 1 capsule (100 mg total) by mouth 2 (two) times daily. (Patient not taking: Reported on 01/19/2015) 10 capsule 0  . polyethylene glycol powder (GLYCOLAX/MIRALAX) powder Take 17 g by mouth 2 (two) times daily as needed. (Patient not taking: Reported on 01/11/2015) 3350 g 1   No current facility-administered medications for this encounter.    REVIEW OF SYSTEMS:  A 15 point review of systems is documented in the electronic medical record. This was obtained by the nursing staff. However, I reviewed this with the patient to discuss relevant findings and make appropriate changes.    PHYSICAL EXAM:  height is '5\' 3"'$  (1.6 m) and weight is 95 lb (43.092 kg). Her oral temperature is 97.5 F (36.4 C). Her blood pressure is 118/75 and her pulse is 102. Her respiration is 18 and oxygen saturation is 95%.  Per Dr. Sondra Come alert, healthy, no distress, thin female, lying in the recliner in light of her significant low back pain SKIN: skin color, texture, turgor  are normal, no rashes or significant lesions, abrasion along the left upper lip area from fall last night HEAD: Normocephalic, No masses, lesions, tenderness or abnormalities EYES: normal, PERRLA OROPHARYNX:no exudate, no erythema and lips, buccal mucosa, and tongue normal, persistent candidal infection along the dorsum of the tongue  NECK: supple, no adenopathy, no JVD LYMPH: no palpable lymphadenopathy, no hepatosplenomegaly BREAST:not examined LUNGS: Inspiratory wheezing  noted along the right lung field HEART: regular rate & rhythm, no murmurs and no gallops ABDOMEN:abdomen soft, non-tender, normal bowel sounds and no masses or organomegaly BACK: Back symmetric, no curvature., Left lumbar paraspinal pain with palpation EXTREMITIES:no joint deformities, effusion, or inflammation, no edema, no skin discoloration  NEURO: alert & oriented x 3 with fluent speech, no focal motor/sensory deficits   KPS = 80  100 - Normal; no complaints; no evidence of disease. 90   - Able to carry on normal activity; minor signs or symptoms of disease. 80   - Normal activity with effort; some signs or symptoms of disease. 3   - Cares for self; unable to carry on normal activity or to do active work. 60   - Requires occasional assistance, but is able to care for most of his personal needs. 50   - Requires considerable assistance and frequent medical care. 23   - Disabled; requires special care and assistance. 39   - Severely disabled; hospital admission is indicated although death not imminent. 81   - Very sick; hospital admission necessary; active supportive treatment necessary. 10   - Moribund; fatal processes progressing rapidly. 0     - Dead  Karnofsky DA, Abelmann [place], Craver LS and [facility] [phone]) The use of the nitrogen mustards in the palliative treatment of carcinoma: with particular reference to bronchogenic carcinoma Cancer 1 [id number]  LABORATORY DATA:  Lab Results  Component Value Date   WBC 22.6* 01/11/2015   HGB 13.3 01/11/2015   HCT 39.8 01/11/2015   MCV 96.1 01/11/2015   PLT 867* 01/11/2015   Lab Results  Component Value Date   NA 136 01/11/2015   K 4.5 01/11/2015   CL 97 12/25/2014   CO2 25 01/11/2015   Lab Results  Component Value Date   ALT 89* 01/11/2015   AST 37* 01/11/2015   ALKPHOS 121 01/11/2015   BILITOT 0.38 01/11/2015     RADIOGRAPHY: Mr Jeri Cos Wo Contrast  01/17/2015   CLINICAL DATA:  Recently diagnosed right upper lobe  non-small-cell lung cancer. Staging. Difficulty walking and left leg numbness.  EXAM: MRI HEAD WITHOUT AND WITH CONTRAST  TECHNIQUE: Multiplanar, multiecho pulse sequences of the brain and surrounding structures were obtained without and with intravenous contrast.  CONTRAST:  26m MULTIHANCE GADOBENATE DIMEGLUMINE 529 MG/ML IV SOLN  COMPARISON:  None.  FINDINGS: There is no evidence of acute infarct, midline shift, or extra-axial fluid collection. There is mild-to-moderate generalized cerebral atrophy. A chronic left basal ganglia infarct is noted with a small amount of chronic blood products and associated ex vacuo dilatation of the left frontal horn. Chronic lacunar infarcts are also noted involving the left retain min, right external capsule, and right thalamus.  There is a 6 mm solidly enhancing lesion in the anterior right temporal lobe with minimal surrounding edema (series 12, image 24). There is a 2.5 x 1.5 x 2.3 cm enhancing mass involving the posterior aspect of the left superior frontal gyrus which extends medially to abut the falx (series 12, image 49). Two punctate foci of susceptibility artifact associated  with the mass may represent a tiny amount of chronic blood products. There is mild-to-moderate surrounding vasogenic edema without mass effect.  Orbits are unremarkable. Trace right sphenoid sinus mucosal thickening and a large right mastoid effusion are noted. No obstructing nasopharyngeal mass is identified. Major intracranial vascular flow voids are preserved. No destructive osseous lesion is identified.  IMPRESSION: 1. Two enhancing brain lesions consistent with metastases, the larger measuring 2.5 cm in the left frontal lobe. Mild-to-moderate surrounding edema without mass effect. 2. 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 3. Large right mastoid effusion.   Electronically Signed   By: Logan Bores   On: 01/17/2015 09:09   Nm Pet Image Initial (pi) Skull Base To Thigh  01/06/2015    CLINICAL DATA:  Initial treatment strategy for lung mass with multiple lesions. Prior history of uterine cancer.  EXAM: NUCLEAR MEDICINE PET SKULL BASE TO THIGH  TECHNIQUE: 4.98 mCi F-18 FDG was injected intravenously. Full-ring PET imaging was performed from the skull base to thigh after the radiotracer. CT data was obtained and used for attenuation correction and anatomic localization.  FASTING BLOOD GLUCOSE:  Value: 130 mg/dl  COMPARISON:  CT chest dated 12/25/2014. MR lumbar spine dated 12/23/2014.  FINDINGS: NECK  No hypermetabolic lymph nodes in the neck.  CHEST  6.8 x 4.6 cm mass centered in the posterior/medial right upper lobe and extending into the superior segment right lower lobe (series 6/image 14), centrally necrotic, max SUV 12.6.  Nodular scarring in the left lower lobe (series 6/ image 48).  5 mm right middle lobe nodule (series 6/ image 30). 2-3 mm nodules in the bilateral upper lobes (series 6/images 13-14). These findings remain non FDG avid although beneath the size threshold for PET sensitivity.  Mild emphysematous changes.  No pleural effusion or pneumothorax.  The heart is normal in size.  No pericardial effusion.  Coronary atherosclerosis. Atherosclerotic calcifications of the aortic arch.  No hypermetabolic thoracic lymphadenopathy.  ABDOMEN/PELVIS  No abnormal hypermetabolic activity within the liver, pancreas, adrenal glands, or spleen.  No hypermetabolic lymph nodes in the abdomen or pelvis.  Status post cholecystectomy. Atherosclerotic calcifications of the abdominal aorta and branch vessels.  Status post hysterectomy.  SKELETON  6.8 x 5.4 cm hypermetabolic left paraspinal mass along the left L2 vertebral body (series 4/ image 121), centrally necrotic, max SUV 9.0. Soft tissue gas within the lesion related to recent intervention (series 4/ image 117). Associated osseous destruction of the left transverse process and left pedicle (series 4/image 115).  IMPRESSION: 6.8 cm mass centered in  the posterior/medial right upper lobe and extending into the superior segment right lower lobe, suspicious for primary bronchogenic neoplasm versus metastasis.  6.8 cm left paraspinal mass with associated osseous destruction and mild epidural extension on recent MRI, suspicious for metastasis or less likely primary nerve sheath tumor. Correlate with pending pathology results.  Otherwise, no evidence of metastatic disease.   Electronically Signed   By: Julian Hy M.D.   On: 01/06/2015 12:09   Ct Biopsy  01/04/2015   CLINICAL DATA:  7 cm right upper lobe lung mass and destructive 7 cm mass in the left lumbar paraspinal soft tissues causing bony destruction at the L2 level. The patient presents for biopsy of the lumbar paraspinal soft tissue mass.  EXAM: CT GUIDED CORE BIOPSY OF LUMBAR PARASPINAL SOFT TISSUE MASS  ANESTHESIA/SEDATION: 1.0  Mg IV Versed; 50 mcg IV Fentanyl  Total Moderate Sedation Time: 12 minutes.  PROCEDURE: The procedure risks, benefits,  and alternatives were explained to the patient. Questions regarding the procedure were encouraged and answered. The patient understands and consents to the procedure.  The left trans lumbar region was prepped with Betadine in a sterile fashion, and a sterile drape was applied covering the operative field. A sterile gown and sterile gloves were used for the procedure. Local anesthesia was provided with 1% Lidocaine.  CT was performed in a prone position. Under CT guidance, a 17 gauge needle was advanced into the left-sided paraspinal soft tissues at the L2 level. After confirming needle tip position, coaxial core biopsy samples were obtained with an 18 gauge device. Four core samples were submitted in formalin. Additional imaging was performed.  COMPLICATIONS: None  FINDINGS: Large destructive soft tissue mass causes bony destruction of the left L2 vertebral body and extends into the posterior paraspinous soft tissues. Solid tissue was obtained from the  lesion.  IMPRESSION: CT-guided core biopsy performed of the destructive left lumbar paraspinous soft tissue mass centered at the L2 level. Solid tissue was obtained.   Electronically Signed   By: Aletta Edouard M.D.   On: 01/04/2015 14:15      IMPRESSION: 64 y.o woman with two brain metastasis from Squamous Cell Carcinoma of the right upper lung. At this point, the patient would potentially benefit from radiotherapy. The options include whole brain irradiation versus stereotactic radiosurgery. There are pros and cons associated with each of these potential treatment options. Whole brain radiotherapy would treat the known metastatic deposits and help provide some reduction of risk for future brain metastases. However, whole brain radiotherapy carries potential risks including hair loss, subacute somnolence, and neurocognitive changes including a possible reduction in short-term memory. Whole brain radiotherapy also may carry a lower likelihood of tumor control at the treatment sites because of the low-dose used. Stereotactic radiosurgery carries a higher likelihood for local tumor control at the targeted sites with lower associated risk for neurocognitive changes such as memory loss. However, the use of stereotactic radiosurgery in this setting may leave the patient at increased risk for new brain metastases elsewhere in the brain as high as 50-60%. Accordingly, patients who receive stereotactic radiosurgery in this setting should undergo ongoing surveillance imaging with brain MRI more frequently in order to identify and treat new small brain metastases before they become symptomatic. Stereotactic radiosurgery does carry some different risks, including a risk of radionecrosis.  PLAN: Today, I reviewed the findings and workup thus far with the patient. We discussed the dilemma regarding whole brain radiotherapy versus stereotactic radiosurgery. We discussed the pros and cons of each. We also discussed the  logistics and delivery of each. We reviewed the results associated with each of the treatments described above. The patient seems to understand the treatment options and would like to proceed with stereotactic radiosurgery.  I spent 60 minutes minutes face to face with the patient and more than 50% of that time was spent in counseling and/or coordination of care.   This document serves as a record of services personally performed by Tyler Pita, MD. It was created on his behalf by Jeralene Peters, a trained medical scribe. The creation of this record is based on the scribe's personal observations and the provider's statements to them. This document has been checked and approved by the attending provider.         ------------------------------------------------  Sheral Apley Tammi Klippel, M.D.

## 2015-01-27 ENCOUNTER — Ambulatory Visit
Admission: RE | Admit: 2015-01-27 | Discharge: 2015-01-27 | Disposition: A | Discharge status: Home / Self Care | Payer: Self-pay | Source: Ambulatory Visit | Attending: Radiation Oncology | Admitting: Radiation Oncology

## 2015-01-27 DIAGNOSIS — C7931 Secondary malignant neoplasm of brain: Secondary | ICD-10-CM

## 2015-01-27 MED ORDER — SODIUM CHLORIDE 0.9 % IJ SOLN
10.0000 mL | Freq: Once | INTRAMUSCULAR | Status: AC
  Administered 2015-01-27: 10 mL via INTRAVENOUS

## 2015-01-27 NOTE — Progress Notes (Signed)
Marcine Matar, RT removed right AC 22 gauge IV. She reports the catheter was intact upon removal. She reports applying a bandaid to the old IV site.

## 2015-01-27 NOTE — Progress Notes (Signed)
  Radiation Oncology         (336) [phone] ________________________________  Name: Dustyn Armbrister MRN: [id number]  Date: 01/27/2015  DOB: [birth year]  SIMULATION AND TREATMENT PLANNING NOTE    ICD-9-CM ICD-10-CM   1. 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  Identity was confirmed.  All relevant records and images related to the planned course of therapy were reviewed.  The patient freely provided informed written consent to proceed with treatment after reviewing the details related to the planned course of therapy. The consent form was witnessed and verified by the simulation staff. Intravenous access was established for contrast administration. Then, the patient was set-up in a stable reproducible supine position for radiation therapy.  A relocatable thermoplastic stereotactic head frame was fabricated for precise immobilization.  CT images were obtained.  Surface markings were placed.  The CT images were loaded into the planning software and fused with the patient's targeting MRI scan.  Then the target and avoidance structures were contoured.  Treatment planning then occurred.  The radiation prescription was entered and confirmed.  I have requested 3D planning  I have requested a DVH of the following structures: Brain stem, brain, left eye, right eye, lenses, optic chiasm, target volumes, uninvolved brain, and normal tissue.    SPECIAL TREATMENT PROCEDURE:  The planned course of therapy using radiation constitutes a special treatment procedure. Special care is required in the management of this patient for the following reasons. This treatment constitutes a Special Treatment Procedure for the following reason: High dose per fraction requiring special monitoring for increased toxicities of treatment including daily imaging.  The  special nature of the planned course of radiotherapy will require increased physician supervision and oversight to ensure patient's safety with optimal treatment outcomes.  PLAN:  The patient will receive 20 Gy in 1 fraction to her two brain metastases.  This document serves as a record of services personally performed by Tyler Pita, MD. It was created on his behalf by Darcus Austin, a trained medical scribe. The creation of this record is based on the scribe's personal observations and the provider's statements to them. This document has been checked and approved by the attending provider.     ________________________________  Sheral Apley. Tammi Klippel, M.D.

## 2015-01-27 NOTE — Progress Notes (Signed)
Received patient in nursing following final radiation treatment to spine for IV start. Patient will complete SRS brain simulation today. Patient denies a contrast allergy. Denies taking Metformin. June 4th BUN 25.8 and creatinine 0.6. Started right AC 22 gauge IV on the first attempt. Excellent blood return obtained. Site flushed without difficulty. Patient tolerated well. Patient and family escorted to CT sim by Aaron Edelman, RT.

## 2015-01-30 ENCOUNTER — Telehealth: Payer: Self-pay | Admitting: Oncology

## 2015-01-30 ENCOUNTER — Telehealth: Payer: Self-pay | Admitting: *Deleted

## 2015-01-30 NOTE — Telephone Encounter (Signed)
Called Laura Vang, Dailah's husband about possible UTI.  Per Laura Vang, she was complaining of pain in her back due to sitting on the bedside comode.  She was able to urinate without difficulty when she used a bed pane.  He said she is not having any burning with urination or fever.

## 2015-01-30 NOTE — Telephone Encounter (Signed)
Patient's sister [name] called stating Helene Kelp still has an UTI and wants a refill called in for this", informed her that I would in basket  Dr. Sondra Come and his nurse Santiago Glad hess this information but I couldn't call in any rx for the patient, An MD would need to do this, if she hasn't heard back from Elmo Putt by 1pm when she comes in please call and ask for Karen,RN 8:22 AM

## 2015-01-30 NOTE — Telephone Encounter (Signed)
Left a message for [name].  Requested a return call.

## 2015-01-31 ENCOUNTER — Telehealth: Payer: Self-pay | Admitting: Oncology

## 2015-01-31 ENCOUNTER — Ambulatory Visit (HOSPITAL_COMMUNITY)
Admission: RE | Admit: 2015-01-31 | Discharge: 2015-01-31 | Disposition: A | Discharge status: Home / Self Care | Payer: Self-pay | Source: Ambulatory Visit | Attending: Radiation Oncology | Admitting: Radiation Oncology

## 2015-01-31 ENCOUNTER — Other Ambulatory Visit: Payer: Self-pay | Admitting: Oncology

## 2015-01-31 DIAGNOSIS — C3490 Malignant neoplasm of unspecified part of unspecified bronchus or lung: Secondary | ICD-10-CM | POA: Insufficient documentation

## 2015-01-31 DIAGNOSIS — G319 Degenerative disease of nervous system, unspecified: Secondary | ICD-10-CM | POA: Insufficient documentation

## 2015-01-31 DIAGNOSIS — Z8673 Personal history of transient ischemic attack (TIA), and cerebral infarction without residual deficits: Secondary | ICD-10-CM | POA: Insufficient documentation

## 2015-01-31 DIAGNOSIS — C7931 Secondary malignant neoplasm of brain: Secondary | ICD-10-CM | POA: Insufficient documentation

## 2015-01-31 MED ORDER — GADOBENATE DIMEGLUMINE 529 MG/ML IV SOLN
9.0000 mL | Freq: Once | INTRAVENOUS | Status: AC | PRN
  Administered 2015-01-31: 9 mL via INTRAVENOUS

## 2015-01-31 NOTE — Telephone Encounter (Signed)
Called Laura Vang and advised him that a urinalysis/culture had been added for Laura Vang lab work for tomorrow.  [name] verbalized understanding.

## 2015-02-02 ENCOUNTER — Other Ambulatory Visit: Payer: Self-pay

## 2015-02-02 ENCOUNTER — Ambulatory Visit: Payer: Self-pay | Admitting: Physician Assistant

## 2015-02-03 ENCOUNTER — Encounter: Payer: Self-pay | Admitting: Radiation Oncology

## 2015-02-03 ENCOUNTER — Ambulatory Visit
Admission: RE | Admit: 2015-02-03 | Discharge: 2015-02-03 | Disposition: A | Discharge status: Home / Self Care | Payer: Self-pay | Source: Ambulatory Visit | Attending: Radiation Oncology | Admitting: Radiation Oncology

## 2015-02-03 ENCOUNTER — Telehealth: Payer: Self-pay | Admitting: Oncology

## 2015-02-03 ENCOUNTER — Other Ambulatory Visit: Payer: Self-pay | Admitting: Radiation Oncology

## 2015-02-03 VITALS — BP 139/81 | HR 102 | Temp 97.6°F | Resp 16

## 2015-02-03 DIAGNOSIS — C3490 Malignant neoplasm of unspecified part of unspecified bronchus or lung: Secondary | ICD-10-CM

## 2015-02-03 DIAGNOSIS — C3491 Malignant neoplasm of unspecified part of right bronchus or lung: Secondary | ICD-10-CM | POA: Insufficient documentation

## 2015-02-03 DIAGNOSIS — C3411 Malignant neoplasm of upper lobe, right bronchus or lung: Secondary | ICD-10-CM

## 2015-02-03 DIAGNOSIS — C7931 Secondary malignant neoplasm of brain: Secondary | ICD-10-CM

## 2015-02-03 DIAGNOSIS — R222 Localized swelling, mass and lump, trunk: Secondary | ICD-10-CM

## 2015-02-03 LAB — COMPREHENSIVE METABOLIC PANEL (CC13)
ALT: 19 U/L (ref 0–55)
AST: 14 U/L (ref 5–34)
Albumin: 2.5 g/dL — ABNORMAL LOW (ref 3.5–5.0)
Alkaline Phosphatase: 97 U/L (ref 40–150)
Anion Gap: 8 mEq/L (ref 3–11)
BUN: 24.2 mg/dL (ref 7.0–26.0)
CO2: 27 mEq/L (ref 22–29)
Calcium: 8.6 mg/dL (ref 8.4–10.4)
Chloride: 102 mEq/L (ref 98–109)
Creatinine: 0.5 mg/dL — ABNORMAL LOW (ref 0.6–1.1)
EGFR: >90 mL/min/{1.73_m2} (ref >90)
Glucose: 111 mg/dl (ref 70–140)
Potassium: 4.1 mEq/L (ref 3.5–5.1)
Sodium: 137 mEq/L (ref 136–145)
Total Bilirubin: 0.34 mg/dL (ref 0.20–1.20)
Total Protein: 5.6 g/dL — ABNORMAL LOW (ref 6.4–8.3)

## 2015-02-03 LAB — CBC WITH DIFFERENTIAL/PLATELET
BASO%: 0.5 % (ref 0.0–2.0)
Basophils Absolute: 0.1 10*3/uL (ref 0.0–0.1)
EOS ABS: 0.1 10*3/uL (ref 0.0–0.5)
EOS%: 0.4 % (ref 0.0–7.0)
HCT: 36 % (ref 34.8–46.6)
HGB: 12.1 g/dL (ref 11.6–15.9)
LYMPH%: 7.3 % — ABNORMAL LOW (ref 14.0–49.7)
MCH: 32.9 pg (ref 25.1–34.0)
MCHC: 33.6 g/dL (ref 31.5–36.0)
MCV: 97.9 fL (ref 79.5–101.0)
MONO#: 1.1 10*3/uL — ABNORMAL HIGH (ref 0.1–0.9)
MONO%: 7 % (ref 0.0–14.0)
NEUT%: 84.8 % — AB (ref 38.4–76.8)
NEUTROS ABS: 13.2 10*3/uL — AB (ref 1.5–6.5)
Platelets: 586 10*3/uL — ABNORMAL HIGH (ref 145–400)
RBC: 3.67 10*6/uL — AB (ref 3.70–5.45)
RDW: 17.9 % — AB (ref 11.2–14.5)
WBC: 15.5 10*3/uL — ABNORMAL HIGH (ref 3.9–10.3)
lymph#: 1.1 10*3/uL (ref 0.9–3.3)

## 2015-02-03 LAB — URINALYSIS, MICROSCOPIC - CHCC
Bilirubin (Urine): NEGATIVE
Blood: NEGATIVE
Glucose: NEGATIVE mg/dL
Ketones: NEGATIVE mg/dL
Nitrite: POSITIVE
Protein: NEGATIVE mg/dL
RBC / HPF: NEGATIVE (ref 0–2)
Specific Gravity, Urine: 1.015 (ref 1.003–1.035)
Urobilinogen, UR: 0.2 mg/dL (ref 0.2–1)
pH: 6.5 (ref 4.6–8.0)

## 2015-02-03 MED ORDER — HYDROCODONE-ACETAMINOPHEN 7.5-325 MG PO TABS: 1.0000 | ORAL_TABLET | Freq: Four times a day (QID) | ORAL | Status: AC | PRN

## 2015-02-03 NOTE — Telephone Encounter (Signed)
Called [facility] to see why Keydi has been discharged.  Per Bianca at [facility], Jerelyn had been discharged on 01/26/15 because she met all her goals. 

## 2015-02-03 NOTE — Progress Notes (Signed)
Received patient, her son, and husband in the clinic following [place] treatment. Patient resting supine on stretch. Reports she feels drowsy after taking her pain medication. Vitals stable. Reports back pain 2 on a scale of 0-01. Denies headache, dizziness, nausea, vomiting, diplopia or ringing in the ears. Understands to avoid strenous activity for the next 24 hours. Also, understands to call [phone] with needs. Urine sample collected. Elmo Putt, RN delivered urine sample to lab.

## 2015-02-03 NOTE — Progress Notes (Signed)
  Radiation Oncology         [phone]) [phone] ________________________________  Stereotactic Treatment Procedure Note  Name: Laura Vang MRN: [id number]  Date: 02/03/2015  DOB: 1950-10-30  SPECIAL TREATMENT PROCEDURE    ICD-9-CM ICD-10-CM   1. Brain metastases 198.3 C79.31     3D TREATMENT PLANNING AND DOSIMETRY:  The patient's radiation plan was reviewed and approved by neurosurgery and radiation oncology prior to treatment.  It showed 3-dimensional radiation distributions overlaid onto the planning CT/MRI image set.  The [facility] for the target structures as well as the organs at risk were reviewed. The documentation of the 3D plan and dosimetry are filed in the radiation oncology EMR.  NARRATIVE:  Laura Vang was brought to the TrueBeam stereotactic radiation treatment machine and placed supine on the CT couch. The head frame was applied, and the patient was set up for stereotactic radiosurgery.  Neurosurgery was present for the set-up and delivery  SIMULATION VERIFICATION:  In the couch zero-angle position, the patient underwent Exactrac imaging using the Brainlab system with orthogonal KV images.  These were carefully aligned and repeated to confirm treatment position for each of the isocenters.  The Exactrac snap film verification was repeated at each couch angle.  PROCEDURE: Laura Vang received stereotactic radiosurgery to the following targets: Left frontal 28 mm target was treated using 4 Dynamic Conformal Arcs to a prescription dose of 18 Gy.  ExacTrac registration was performed for each couch angle.  The 100% isodose line was prescribed.  6 MV X-rays were delivered in the flattening filter free beam mode. Right temporal 7 mm target was treated using 4 Dynamic Conformal Arcs to a prescription dose of 20 Gy.  ExacTrac registration was performed for each couch angle.  The 100% isodose line was prescribed.  6 MV X-rays were delivered in the flattening filter free beam  mode.  STEREOTACTIC TREATMENT MANAGEMENT:  Following delivery, the patient was transported to nursing in stable condition and monitored for possible acute effects.  Vital signs were recorded BP 139/81 mmHg  Pulse 102  Temp(Src) 97.6 F (36.4 C) (Oral)  Resp 16  SpO2 100%. The patient tolerated treatment without significant acute effects, and was discharged to home in stable condition.    PLAN: Follow-up in one month.  This document serves as a record of services personally performed by Tyler Pita, MD. It was created on his behalf by Darcus Austin, a trained medical scribe. The creation of this record is based on the scribe's personal observations and the provider's statements to them. This document has been checked and approved by the attending provider.     ________________________________  Sheral Apley. Tammi Klippel, M.D.

## 2015-02-03 NOTE — Telephone Encounter (Signed)
Called in prescription to [facility] per Dr. Tammi Klippel for ciprofloxacin (CIPRO) 500 MG tablet - Take 500 mg by mouth 2 (two) times daily. Disp. 20 tablets. 0 Refills.  Called Laura Vang and let him know if had been called in and also that [facility] has been consulted for another home health referal.  Laura Vang verbalized understanding and agreement. Called Kristen with [facility] to notify her of the referral.

## 2015-02-03 NOTE — Op Note (Signed)
  Name: Laura Vang  MRN: [id number]  Date: 02/03/2015   DOB: [birth year]  Stereotactic Radiosurgery Operative Note  PRE-OPERATIVE DIAGNOSIS:  Multiple Brain Metastases  POST-OPERATIVE DIAGNOSIS:  Multiple Brain Metastases  PROCEDURE:  Stereotactic Radiosurgery  SURGEON:  Peggyann Shoals, MD  NARRATIVE: The patient underwent a radiation treatment planning session in the radiation oncology simulation suite under the care of the radiation oncology physician and physicist.  I participated closely in the radiation treatment planning afterwards. The patient underwent planning CT which was fused to 3T high resolution MRI with 1 mm axial slices.  These images were fused on the planning system.  We contoured the gross target volumes and subsequently expanded this to yield the Planning Target Volume. I actively participated in the planning process.  I helped to define and review the target contours and also the contours of the optic pathway, eyes, brainstem and selected nearby organs at risk.  All the dose constraints for critical structures were reviewed and compared to AAPM Task Group 101.  The prescription dose conformity was reviewed.  I approved the plan electronically.    Accordingly, Joesphine Schemm was brought to the TrueBeam stereotactic radiation treatment linac and placed in the custom immobilization mask.  The patient was aligned according to the IR fiducial markers with BrainLab Exactrac, then orthogonal x-rays were used in ExacTrac with the 6DOF robotic table and the shifts were made to align the patient  Chari Manning received stereotactic radiosurgery uneventfully.    Lesions treated:  2   Complex lesions treated:  0 (>3.5 cm, <52m of optic path, or within the brainstem)   The detailed description of the procedure is recorded in the radiation oncology procedure note.  I was present for the duration of the procedure.  DISPOSITION:  Following delivery, the patient was transported to  nursing in stable condition and monitored for possible acute effects to be discharged to home in stable condition with follow-up in one month.  SPeggyann Shoals MD 02/03/2015 4:47 PM

## 2015-02-05 LAB — URINE CULTURE

## 2015-02-05 NOTE — Progress Notes (Signed)
°  Radiation Oncology         [phone] ________________________________  Name: Laura Vang MRN: [id number]  Date: 02/03/2015  DOB: [birth year]  End of Treatment Note   ICD-9-CM ICD-10-CM   1. 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   Her back pain improved during the course of treatments.  Plan: The patient has completed radiation treatment. The patient will return to radiation oncology clinic for routine followup in one month. I advised them to call or return sooner if they have any questions or concerns related to their recovery or treatment. She will proceed with SRS treatment directed at the 2 lesions within the brain under the direction of Dr. Tammi Klippel.  -----------------------------------  Blair Promise, PhD, MD

## 2015-02-06 ENCOUNTER — Telehealth: Payer: Self-pay | Admitting: Internal Medicine

## 2015-02-06 ENCOUNTER — Encounter: Payer: Self-pay | Admitting: *Deleted

## 2015-02-06 ENCOUNTER — Other Ambulatory Visit: Payer: Self-pay | Admitting: *Deleted

## 2015-02-06 DIAGNOSIS — C3490 Malignant neoplasm of unspecified part of unspecified bronchus or lung: Secondary | ICD-10-CM

## 2015-02-06 NOTE — Progress Notes (Signed)
Oncology Nurse Navigator Documentation  Oncology Nurse Navigator Flowsheets 02/06/2015  Navigator Encounter Type Other  Patient Visit Type Follow-up  Treatment Phase Other/I received a call from Laura Vang today about her follow up appt with Dr. Julien Nordmann.  POF placed on 01/11/15 stated to set up follow up appt in 3 weeks.  Unfortunately, this was not scheduled.  I updated Dr. Julien Nordmann and he is aware.  I placed another urgent POF and will follow up with in-basket to scheduling.    Time Spent with Patient 45

## 2015-02-06 NOTE — Telephone Encounter (Signed)
S/p spouse advised of labs/ov per 06/27 POF, pt's spouse confirmed D/T... KJ

## 2015-02-08 ENCOUNTER — Telehealth: Payer: Self-pay | Admitting: Internal Medicine

## 2015-02-08 ENCOUNTER — Other Ambulatory Visit (HOSPITAL_BASED_OUTPATIENT_CLINIC_OR_DEPARTMENT_OTHER): Payer: Self-pay

## 2015-02-08 ENCOUNTER — Encounter: Payer: Self-pay | Admitting: Internal Medicine

## 2015-02-08 ENCOUNTER — Ambulatory Visit (HOSPITAL_BASED_OUTPATIENT_CLINIC_OR_DEPARTMENT_OTHER): Payer: Self-pay | Admitting: Internal Medicine

## 2015-02-08 VITALS — BP 149/95 | HR 105 | Temp 98.3°F | Resp 18

## 2015-02-08 DIAGNOSIS — C7931 Secondary malignant neoplasm of brain: Secondary | ICD-10-CM

## 2015-02-08 DIAGNOSIS — C3411 Malignant neoplasm of upper lobe, right bronchus or lung: Secondary | ICD-10-CM

## 2015-02-08 DIAGNOSIS — C3490 Malignant neoplasm of unspecified part of unspecified bronchus or lung: Secondary | ICD-10-CM

## 2015-02-08 LAB — COMPREHENSIVE METABOLIC PANEL (CC13)
ALT: 16 U/L (ref 0–55)
AST: 13 U/L (ref 5–34)
Albumin: 2.5 g/dL — ABNORMAL LOW (ref 3.5–5.0)
Alkaline Phosphatase: 107 U/L (ref 40–150)
Anion Gap: 10 meq/L (ref 3–11)
BUN: 28.7 mg/dL — ABNORMAL HIGH (ref 7.0–26.0)
CO2: 22 meq/L (ref 22–29)
Calcium: 8.9 mg/dL (ref 8.4–10.4)
Chloride: 102 meq/L (ref 98–109)
Creatinine: 0.5 mg/dL — ABNORMAL LOW (ref 0.6–1.1)
EGFR: >90 mL/min/{1.73_m2}
Glucose: 160 mg/dL — ABNORMAL HIGH (ref 70–140)
Potassium: 4 meq/L (ref 3.5–5.1)
Sodium: 135 meq/L — ABNORMAL LOW (ref 136–145)
Total Bilirubin: 0.34 mg/dL (ref 0.20–1.20)
Total Protein: 6 g/dL — ABNORMAL LOW (ref 6.4–8.3)

## 2015-02-08 LAB — CBC WITH DIFFERENTIAL/PLATELET
BASO%: 1.1 % (ref 0.0–2.0)
BASOS ABS: 0.2 10*3/uL — AB (ref 0.0–0.1)
EOS%: 0 % (ref 0.0–7.0)
Eosinophils Absolute: 0 10*3/uL (ref 0.0–0.5)
HCT: 36.9 % (ref 34.8–46.6)
HEMOGLOBIN: 12.6 g/dL (ref 11.6–15.9)
LYMPH#: 0.4 10*3/uL — AB (ref 0.9–3.3)
LYMPH%: 2.6 % — ABNORMAL LOW (ref 14.0–49.7)
MCH: 33.4 pg (ref 25.1–34.0)
MCHC: 34.3 g/dL (ref 31.5–36.0)
MCV: 97.4 fL (ref 79.5–101.0)
MONO#: 1.1 10*3/uL — AB (ref 0.1–0.9)
MONO%: 6.6 % (ref 0.0–14.0)
NEUT#: 15.1 10*3/uL — ABNORMAL HIGH (ref 1.5–6.5)
NEUT%: 89.7 % — ABNORMAL HIGH (ref 38.4–76.8)
Platelets: 711 10*3/uL — ABNORMAL HIGH (ref 145–400)
RBC: 3.78 10*6/uL (ref 3.70–5.45)
RDW: 17.4 % — AB (ref 11.2–14.5)
WBC: 16.8 10*3/uL — AB (ref 3.9–10.3)

## 2015-02-08 NOTE — Telephone Encounter (Signed)
s.w. pt and advised on July appt...ok and aware

## 2015-02-08 NOTE — Patient Instructions (Signed)
Smoking Cessation Quitting smoking is important to your health and has many advantages. However, it is not always easy to quit since nicotine is a very addictive drug. Oftentimes, people try 3 times or more before being able to quit. This document explains the best ways for you to prepare to quit smoking. Quitting takes hard work and a lot of effort, but you can do it. ADVANTAGES OF QUITTING SMOKING  You will live longer, feel better, and live better.  Your body will feel the impact of quitting smoking almost immediately.  Within 20 minutes, blood pressure decreases. Your pulse returns to its normal level.  After 8 hours, carbon monoxide levels in the blood return to normal. Your oxygen level increases.  After 24 hours, the chance of having a heart attack starts to decrease. Your breath, hair, and body stop smelling like smoke.  After 48 hours, damaged nerve endings begin to recover. Your sense of taste and smell improve.  After 72 hours, the body is virtually free of nicotine. Your bronchial tubes relax and breathing becomes easier.  After 2 to 12 weeks, lungs can hold more air. Exercise becomes easier and circulation improves.  The risk of having a heart attack, stroke, cancer, or lung disease is greatly reduced.  After 1 year, the risk of coronary heart disease is cut in half.  After 5 years, the risk of stroke falls to the same as a nonsmoker.  After 10 years, the risk of lung cancer is cut in half and the risk of other cancers decreases significantly.  After 15 years, the risk of coronary heart disease drops, usually to the level of a nonsmoker.  If you are pregnant, quitting smoking will improve your chances of having a healthy baby.  The people you live with, especially any children, will be healthier.  You will have extra money to spend on things other than cigarettes. QUESTIONS TO THINK ABOUT BEFORE ATTEMPTING TO QUIT You may want to talk about your answers with your  health care provider.  Why do you want to quit?  If you tried to quit in the past, what helped and what did not?  What will be the most difficult situations for you after you quit? How will you plan to handle them?  Who can help you through the tough times? Your family? Friends? A health care provider?  What pleasures do you get from smoking? What ways can you still get pleasure if you quit? Here are some questions to ask your health care provider:  How can you help me to be successful at quitting?  What medicine do you think would be best for me and how should I take it?  What should I do if I need more help?  What is smoking withdrawal like? How can I get information on withdrawal? GET READY  Set a quit date.  Change your environment by getting rid of all cigarettes, ashtrays, matches, and lighters in your home, car, or work. Do not let people smoke in your home.  Review your past attempts to quit. Think about what worked and what did not. GET SUPPORT AND ENCOURAGEMENT You have a better chance of being successful if you have help. You can get support in many ways.  Tell your family, friends, and coworkers that you are going to quit and need their support. Ask them not to smoke around you.  Get individual, group, or telephone counseling and support. Programs are available at local hospitals and health centers. Call   your local health department for information about programs in your area.  Spiritual beliefs and practices may help some smokers quit.  Download a "quit meter" on your computer to keep track of quit statistics, such as how long you have gone without smoking, cigarettes not smoked, and money saved.  Get a self-help book about quitting smoking and staying off tobacco. LEARN NEW SKILLS AND BEHAVIORS  Distract yourself from urges to smoke. Talk to someone, go for a walk, or occupy your time with a task.  Change your normal routine. Take a different route to work.  Drink tea instead of coffee. Eat breakfast in a different place.  Reduce your stress. Take a hot bath, exercise, or read a book.  Plan something enjoyable to do every day. Reward yourself for not smoking.  Explore interactive web-based programs that specialize in helping you quit. GET MEDICINE AND USE IT CORRECTLY Medicines can help you stop smoking and decrease the urge to smoke. Combining medicine with the above behavioral methods and support can greatly increase your chances of successfully quitting smoking.  Nicotine replacement therapy helps deliver nicotine to your body without the negative effects and risks of smoking. Nicotine replacement therapy includes nicotine gum, lozenges, inhalers, nasal sprays, and skin patches. Some may be available over-the-counter and others require a prescription.  Antidepressant medicine helps people abstain from smoking, but how this works is unknown. This medicine is available by prescription.  Nicotinic receptor partial agonist medicine simulates the effect of nicotine in your brain. This medicine is available by prescription. Ask your health care provider for advice about which medicines to use and how to use them based on your health history. Your health care provider will tell you what side effects to look out for if you choose to be on a medicine or therapy. Carefully read the information on the package. Do not use any other product containing nicotine while using a nicotine replacement product.  RELAPSE OR DIFFICULT SITUATIONS Most relapses occur within the first 3 months after quitting. Do not be discouraged if you start smoking again. Remember, most people try several times before finally quitting. You may have symptoms of withdrawal because your body is used to nicotine. You may crave cigarettes, be irritable, feel very hungry, cough often, get headaches, or have difficulty concentrating. The withdrawal symptoms are only temporary. They are strongest  when you first quit, but they will go away within 10-14 days. To reduce the chances of relapse, try to:  Avoid drinking alcohol. Drinking lowers your chances of successfully quitting.  Reduce the amount of caffeine you consume. Once you quit smoking, the amount of caffeine in your body increases and can give you symptoms, such as a rapid heartbeat, sweating, and anxiety.  Avoid smokers because they can make you want to smoke.  Do not let weight gain distract you. Many smokers will gain weight when they quit, usually less than 10 pounds. Eat a healthy diet and stay active. You can always lose the weight gained after you quit.  Find ways to improve your mood other than smoking. FOR MORE INFORMATION  www.smokefree.gov  Document Released: 07/23/2001 Document Revised: 12/13/2013 Document Reviewed: 11/07/2011 [employer] Patient Information 2015 [employer]. This information is not intended to replace advice given to you by your health care provider. Make sure you discuss any questions you have with your health care provider.  

## 2015-02-08 NOTE — Progress Notes (Signed)
[facility] [phone]) [phone]   Fax:(336) [phone]  OFFICE PROGRESS NOTE  Kathlen Brunswick, PA-C [place] [address]  DIAGNOSIS: Stage IV (T3, N0, M1b) non-small cell lung cancer, squamous cell carcinoma diagnosed in May 2016.  PRIOR THERAPY:  1) Stereotactic radiotherapy to 2 metastatic brain lesions under the care of Dr. Tammi Klippel completed on 01/27/2015. 2) Palliative radiotherapy to the left paraspinal mass under the care of Dr. Sondra Come.  CURRENT THERAPY: Systemic chemotherapy with carboplatin for AUC of 5 on day 1, gemcitabine 1000 MG/M2 on days 1 and 8 as well as Portrazza 800 MG IV on days 1 and 8 every 3 weeks. First cycle 02/16/2015.  INTERVAL HISTORY: Codee Bloodworth 64 y.o. female returns to the clinic today for follow-up visit accompanied by her husband and friend. The patient recently completed a course of palliative radiotherapy to the left paraspinal mass under the care of Dr. Sondra Come in addition to stereotactic radiotherapy to 2 metastatic brain lesions under the care of Dr. Tammi Klippel completed on 01/27/2015. She is feeling fine today with no specific complaints except for persistent back pain. She is currently on treatment with OxyContin 10 mg by mouth every 12 hours in addition to Norco 7.5/325 mg every 4-6 hours as needed for pain. The patient is also on a tapered dose of Decadron 4 mg 3 times a day. The patient denied having any significant weight loss or night sweats. She has no nausea or vomiting. She denied having any significant chest pain, shortness breath, cough or hemoptysis. She is here today for evaluation and discussion of her systemic treatment options.  MEDICAL HISTORY: Past Medical History  Diagnosis Date  . Uterine cancer   . Anxiety   . Lupus   . Lung cancer     stage IV non small cell lung cancer, squamous cell carcinoma dx May 2016    ALLERGIES:  has No Known Allergies.  MEDICATIONS:  Current Outpatient  Prescriptions  Medication Sig Dispense Refill  . ciprofloxacin (CIPRO) 500 MG tablet Take 500 mg by mouth 2 (two) times daily. Disp. 20 tablets. 0 Refills.  Called in to [facility]  per Dr. Tammi Klippel.    Marland Kitchen dexamethasone (DECADRON) 4 MG tablet Take 1 tablet (4 mg total) by mouth 3 (three) times daily. 90 tablet 0  . HYDROcodone-acetaminophen (NORCO) 7.5-325 MG per tablet Take 1 tablet by mouth every 6 (six) hours as needed for moderate pain. 40 tablet 0  . LORazepam (ATIVAN) 0.5 MG tablet Take 1 tablet (0.5 mg total) by mouth every 8 (eight) hours as needed for anxiety. 40 tablet 0  . OxyCODONE (OXYCONTIN) 10 mg T12A 12 hr tablet Take 1 tablet (10 mg total) by mouth every 12 (twelve) hours. 60 tablet 0  . polyethylene glycol powder (GLYCOLAX/MIRALAX) powder Take 17 g by mouth 2 (two) times daily as needed. 3350 g 1  . docusate sodium (COLACE) 100 MG capsule Take 1 capsule (100 mg total) by mouth 2 (two) times daily. (Patient not taking: Reported on 01/19/2015) 10 capsule 0   No current facility-administered medications for this visit.    SURGICAL HISTORY:  Past Surgical History  Procedure Laterality Date  . Appendectomy    . Cholecystectomy    . Abdominal hysterectomy      REVIEW OF SYSTEMS:  Constitutional: positive for fatigue Eyes: negative Ears, nose, mouth, throat, and face: negative Respiratory: negative Cardiovascular: negative Gastrointestinal: negative Genitourinary:negative Integument/breast: negative Hematologic/lymphatic: negative Musculoskeletal:positive for back pain Neurological:  negative Behavioral/Psych: negative Endocrine: negative Allergic/Immunologic: negative   PHYSICAL EXAMINATION: General appearance: alert, cooperative, fatigued and no distress Head: Normocephalic, without obvious abnormality, atraumatic Neck: no adenopathy, no JVD, supple, symmetrical, trachea midline and thyroid not enlarged, symmetric, no tenderness/mass/nodules Lymph nodes:  Cervical, supraclavicular, and axillary nodes normal. Resp: clear to auscultation bilaterally Back: symmetric, no curvature. ROM normal. No CVA tenderness. Cardio: regular rate and rhythm, S1, S2 normal, no murmur, click, rub or gallop GI: soft, non-tender; bowel sounds normal; no masses,  no organomegaly Extremities: extremities normal, atraumatic, no cyanosis or edema Neurologic: Alert and oriented X 3, normal strength and tone. Normal symmetric reflexes. Normal coordination and gait  ECOG PERFORMANCE STATUS: 2 - Symptomatic, <50% confined to bed  Blood pressure 149/95, pulse 105, temperature 98.3 F (36.8 C), temperature source Oral, resp. rate 18, SpO2 98 %.  LABORATORY DATA: Lab Results  Component Value Date   WBC 16.8* 02/08/2015   HGB 12.6 02/08/2015   HCT 36.9 02/08/2015   MCV 97.4 02/08/2015   PLT 711* 02/08/2015      Chemistry      Component Value Date/Time   NA 135* 02/08/2015 1149   NA 137 12/25/2014 0941   K 4.0 02/08/2015 1149   K 3.7 12/25/2014 0941   CL 97 12/25/2014 0941   CO2 22 02/08/2015 1149   CO2 29 12/25/2014 0941   BUN 28.7* 02/08/2015 1149   BUN 11 12/25/2014 0941   CREATININE 0.5* 02/08/2015 1149   CREATININE 0.50 12/25/2014 0941      Component Value Date/Time   CALCIUM 8.9 02/08/2015 1149   CALCIUM 8.9 12/25/2014 0941   ALKPHOS 107 02/08/2015 1149   ALKPHOS 90 12/25/2014 0941   AST 13 02/08/2015 1149   AST 8 12/25/2014 0941   ALT 16 02/08/2015 1149   ALT <8 12/25/2014 0941   BILITOT 0.34 02/08/2015 1149   BILITOT 0.3 12/25/2014 0941       RADIOGRAPHIC STUDIES: Mr Jeri Cos Wo Contrast  February 20, 2015   CLINICAL DATA:  Stage IV non-small-cell car lung cancer. Lung mass. Treatment planning.  EXAM: MRI HEAD WITHOUT AND WITH CONTRAST  TECHNIQUE: Multiplanar, multiecho pulse sequences of the brain and surrounding structures were obtained without and with intravenous contrast.  CONTRAST:  18m MULTIHANCE GADOBENATE DIMEGLUMINE 529 MG/ML IV SOLN   COMPARISON:  01/17/2015.  FINDINGS: Redemonstrated are two intracranial metastases as identified on the previous scan from 01/17/2015. The RIGHT anterior temporal metastasis with central necrosis measures 7 mm in diameter, with slight surrounding edema, slightly increased from priors. The LEFT frontal parasagittal lesion is also increased in size, now 16 x 28 mm, with mild surrounding edema. Tiny foci of susceptibility associated with the more superior lesion could represent calcification or blood, similar to priors.  Baseline atrophy with chronic microvascular ischemic change. Remote LEFT basal ganglia infarct with chronic blood products. Chronic lacunar infarcts are also noted in the LEFT lentiform nucleus, RIGHT external capsule, and RIGHT thalamus. Flow voids are maintained. Extracranial soft tissues unremarkable.  IMPRESSION: Only two intracranial metastases are identified as predicted from the previous exam. These have slightly increased in size over the two week interval. See discussion above.   Electronically Signed   By: JStaci RighterM.D.   On: 007-06-1618:26   Mr BJeri CosWPPContrast  01/17/2015   CLINICAL DATA:  Recently diagnosed right upper lobe non-small-cell lung cancer. Staging. Difficulty walking and left leg numbness.  EXAM: MRI HEAD WITHOUT AND WITH CONTRAST  TECHNIQUE: Multiplanar, multiecho  pulse sequences of the brain and surrounding structures were obtained without and with intravenous contrast.  CONTRAST:  30m MULTIHANCE GADOBENATE DIMEGLUMINE 529 MG/ML IV SOLN  COMPARISON:  None.  FINDINGS: There is no evidence of acute infarct, midline shift, or extra-axial fluid collection. There is mild-to-moderate generalized cerebral atrophy. A chronic left basal ganglia infarct is noted with a small amount of chronic blood products and associated ex vacuo dilatation of the left frontal horn. Chronic lacunar infarcts are also noted involving the left retain min, right external capsule, and right  thalamus.  There is a 6 mm solidly enhancing lesion in the anterior right temporal lobe with minimal surrounding edema (series 12, image 24). There is a 2.5 x 1.5 x 2.3 cm enhancing mass involving the posterior aspect of the left superior frontal gyrus which extends medially to abut the falx (series 12, image 49). Two punctate foci of susceptibility artifact associated with the mass may represent a tiny amount of chronic blood products. There is mild-to-moderate surrounding vasogenic edema without mass effect.  Orbits are unremarkable. Trace right sphenoid sinus mucosal thickening and a large right mastoid effusion are noted. No obstructing nasopharyngeal mass is identified. Major intracranial vascular flow voids are preserved. No destructive osseous lesion is identified.  IMPRESSION: 1. Two enhancing brain lesions consistent with metastases, the larger measuring 2.5 cm in the left frontal lobe. Mild-to-moderate surrounding edema without mass effect. 2. 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 3. Large right mastoid effusion.   Electronically Signed   By: ALogan Bores  On: 01/17/2015 09:09    ASSESSMENT AND PLAN: This is a very pleasant 64years old white female recently diagnosed with stage IV non-small cell lung cancer, squamous cell carcinoma presented with large right upper lobe lung mass in addition to left paraspinal destructive soft tissue lesion that was biopsy proven to be squamous cell carcinoma. She status post palliative radiotherapy to the left paraspinal mass in addition to stereotactic radiotherapy to the metastatic brain lesions. I had a lengthy discussion with the patient and her family today about her current condition and treatment options. I gave the patient the option of palliative care and hospice referral versus consideration of systemic chemotherapy with carboplatin for AUC of 5 on day 1 and gemcitabine 1000 mg on days 1 and 8 as well as Portrazza 800 mg on days 1 and 8 every 3  weeks. I discussed with the patient and her family the adverse effect of this treatment including but not limited to skin rash, diarrhea, myelosuppression, alopecia, nausea and vomiting, liver or renal dysfunction. The patient would like to proceed with the systemic chemotherapy. I will arrange for her to receive the first cycle of this treatment on 02/16/2015. We will continue to monitor her lab closely including weekly magnesium. She will come back for follow-up visit in 2 weeks for reevaluation and management of any adverse effect of her treatment. She was advised to call immediately if she has any concerning symptoms in the interval. The patient voices understanding of current disease status and treatment options and is in agreement with the current care plan.  All questions were answered. The patient knows to call the clinic with any problems, questions or concerns. We can certainly see the patient much sooner if necessary.  I spent 15 minutes counseling the patient face to face. The total time spent in the appointment was 25 minutes.  Disclaimer: This note was dictated with voice recognition software. Similar sounding words can inadvertently be  transcribed and may not be corrected upon review.

## 2015-02-08 NOTE — Progress Notes (Signed)
  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  The lumbar paraspinal mass was treated to 35 Gy in 14 fractions of 2.5 Gy - 5/31-6/17/16 2.  The left frontal 28 mm brain met was treated to 18 Gy in one fraction - 02/03/15 3  The Right temporal 7 mm target was treated to 20 Gy in one fraction - 02/03/15  Beams/energy:    1.  The lumbar paraspinal mass was treated with AP PA fields and 6 and 15 MV X-rays 2.  The left frontal 28 mm brain met was treated using 4 Dynamic Conformal Arcs to a prescription dose of 18 Gy.  ExacTrac registration was performed for each couch angle.  The 100% isodose line was prescribed.  6 MV X-rays were delivered in the flattening filter free beam mode. 3  The Right temporal 7 mm target was treated using 4 Dynamic Conformal Arcs to a prescription dose of 20 Gy.  ExacTrac registration was performed for each couch angle.  The 100% isodose line was prescribed.  6 MV X-rays were delivered in the flattening filter free beam mode.  Narrative: The patient tolerated radiation treatment relatively well.   Her back pain improved.  Plan: The patient has completed radiation treatment. The patient will return to radiation oncology clinic for routine followup in one month. I advised her to call or return sooner if she has any questions or concerns related to her recovery or treatment. ________________________________  Sheral Apley. Tammi Klippel, M.D.

## 2015-02-09 ENCOUNTER — Other Ambulatory Visit: Payer: Self-pay | Admitting: Medical Oncology

## 2015-02-09 DIAGNOSIS — B370 Candidal stomatitis: Secondary | ICD-10-CM

## 2015-02-09 MED ORDER — FLUCONAZOLE 200 MG PO TABS: 200.0000 mg | ORAL_TABLET | Freq: Every day | ORAL | Status: AC

## 2015-02-13 ENCOUNTER — Emergency Department (HOSPITAL_COMMUNITY)
Admission: EM | Admit: 2015-02-13 | Discharge: 2015-03-13 | Disposition: E | Payer: Self-pay | Attending: Internal Medicine | Admitting: Internal Medicine

## 2015-02-13 ENCOUNTER — Emergency Department (HOSPITAL_COMMUNITY): Payer: Self-pay

## 2015-02-13 DIAGNOSIS — Z8541 Personal history of malignant neoplasm of cervix uteri: Secondary | ICD-10-CM | POA: Insufficient documentation

## 2015-02-13 DIAGNOSIS — I2101 ST elevation (STEMI) myocardial infarction involving left main coronary artery: Secondary | ICD-10-CM

## 2015-02-13 DIAGNOSIS — J9600 Acute respiratory failure, unspecified whether with hypoxia or hypercapnia: Secondary | ICD-10-CM

## 2015-02-13 DIAGNOSIS — Z8739 Personal history of other diseases of the musculoskeletal system and connective tissue: Secondary | ICD-10-CM | POA: Insufficient documentation

## 2015-02-13 DIAGNOSIS — Z720 Tobacco use: Secondary | ICD-10-CM | POA: Insufficient documentation

## 2015-02-13 DIAGNOSIS — Z79899 Other long term (current) drug therapy: Secondary | ICD-10-CM | POA: Insufficient documentation

## 2015-02-13 DIAGNOSIS — J9601 Acute respiratory failure with hypoxia: Secondary | ICD-10-CM | POA: Insufficient documentation

## 2015-02-13 DIAGNOSIS — Z85118 Personal history of other malignant neoplasm of bronchus and lung: Secondary | ICD-10-CM | POA: Insufficient documentation

## 2015-02-13 DIAGNOSIS — I213 ST elevation (STEMI) myocardial infarction of unspecified site: Secondary | ICD-10-CM

## 2015-02-13 DIAGNOSIS — R570 Cardiogenic shock: Secondary | ICD-10-CM

## 2015-02-13 DIAGNOSIS — F419 Anxiety disorder, unspecified: Secondary | ICD-10-CM | POA: Insufficient documentation

## 2015-02-13 DIAGNOSIS — R000 Tachycardia, unspecified: Secondary | ICD-10-CM | POA: Insufficient documentation

## 2015-02-13 LAB — CBC
HCT: 33.6 % — ABNORMAL LOW (ref 36.0–46.0)
HEMOGLOBIN: 11.1 g/dL — AB (ref 12.0–15.0)
MCH: 33.4 pg (ref 26.0–34.0)
MCHC: 33 g/dL (ref 30.0–36.0)
MCV: 101.2 fL — ABNORMAL HIGH (ref 78.0–100.0)
PLATELETS: 369 10*3/uL (ref 150–400)
RBC: 3.32 MIL/uL — AB (ref 3.87–5.11)
RDW: 17.1 % — ABNORMAL HIGH (ref 11.5–15.5)
WBC: 31.2 10*3/uL — AB (ref 4.0–10.5)

## 2015-02-13 LAB — URINALYSIS, ROUTINE W REFLEX MICROSCOPIC
Bilirubin Urine: NEGATIVE
GLUCOSE, UA: NEGATIVE mg/dL
Hgb urine dipstick: NEGATIVE
Ketones, ur: NEGATIVE mg/dL
Leukocytes, UA: NEGATIVE
Nitrite: NEGATIVE
Protein, ur: NEGATIVE mg/dL
Specific Gravity, Urine: 1.02 (ref 1.005–1.030)
Urobilinogen, UA: 1 mg/dL (ref 0.0–1.0)
pH: 6.5 (ref 5.0–8.0)

## 2015-02-13 LAB — PROTIME-INR
INR: 1.25 (ref 0.00–1.49)
Prothrombin Time: 15.8 seconds — ABNORMAL HIGH (ref 11.6–15.2)

## 2015-02-13 LAB — POC OCCULT BLOOD, ED: FECAL OCCULT BLD: POSITIVE — AB

## 2015-02-13 LAB — I-STAT TROPONIN, ED: Troponin i, poc: 0.91 ng/mL (ref 0.00–0.08)

## 2015-02-13 LAB — APTT: aPTT: 20 seconds — ABNORMAL LOW (ref 24–37)

## 2015-02-13 LAB — I-STAT CG4 LACTIC ACID, ED: Lactic Acid, Venous: 4.51 mmol/L (ref 0.5–2.0)

## 2015-02-13 MED ORDER — SUCCINYLCHOLINE CHLORIDE 20 MG/ML IJ SOLN
INTRAMUSCULAR | Status: AC
  Filled 2015-02-13: qty 1

## 2015-02-13 MED ORDER — PIPERACILLIN-TAZOBACTAM 3.375 G IVPB 30 MIN
3.3750 g | Freq: Once | INTRAVENOUS | Status: AC
  Administered 2015-02-13: 3.375 g via INTRAVENOUS
  Filled 2015-02-13: qty 50

## 2015-02-13 MED ORDER — SODIUM CHLORIDE 0.9 % IV SOLN
10.0000 mg/h | INTRAVENOUS | Status: DC
  Administered 2015-02-13: 10 mg/h via INTRAVENOUS
  Filled 2015-02-13: qty 10

## 2015-02-13 MED ORDER — MIDAZOLAM BOLUS VIA INFUSION
5.0000 mg | INTRAVENOUS | Status: DC | PRN
  Filled 2015-02-13: qty 20

## 2015-02-13 MED ORDER — MORPHINE SULFATE 25 MG/ML IV SOLN
10.0000 mg/h | INTRAVENOUS | Status: DC
  Administered 2015-02-13: 10 mg/h via INTRAVENOUS
  Filled 2015-02-13: qty 10

## 2015-02-13 MED ORDER — VANCOMYCIN HCL IN DEXTROSE 750-5 MG/150ML-% IV SOLN: 750.0000 mg | INTRAVENOUS | Status: DC

## 2015-02-13 MED ORDER — PIPERACILLIN-TAZOBACTAM 3.375 G IVPB: 3.3750 g | Freq: Three times a day (TID) | INTRAVENOUS | Status: DC

## 2015-02-13 MED ORDER — ROCURONIUM BROMIDE 50 MG/5ML IV SOLN
INTRAVENOUS | Status: AC
  Administered 2015-02-13: 60 mg
  Filled 2015-02-13: qty 2

## 2015-02-13 MED ORDER — MORPHINE BOLUS VIA INFUSION
5.0000 mg | INTRAVENOUS | Status: DC | PRN
  Administered 2015-02-13: 10 mg via INTRAVENOUS
  Filled 2015-02-13: qty 20

## 2015-02-13 MED ORDER — ETOMIDATE 2 MG/ML IV SOLN
INTRAVENOUS | Status: AC
  Administered 2015-02-13: 20 mg
  Filled 2015-02-13: qty 20

## 2015-02-13 MED ORDER — ASPIRIN 300 MG RE SUPP
300.0000 mg | Freq: Once | RECTAL | Status: AC
  Administered 2015-02-13: 300 mg via RECTAL
  Filled 2015-02-13: qty 1

## 2015-02-13 MED ORDER — SODIUM CHLORIDE 0.9 % IV SOLN
INTRAVENOUS | Status: DC
  Administered 2015-02-13: 14:00:00 via INTRAVENOUS

## 2015-02-13 MED ORDER — VANCOMYCIN HCL IN DEXTROSE 1-5 GM/200ML-% IV SOLN
1000.0000 mg | Freq: Once | INTRAVENOUS | Status: DC
  Administered 2015-02-13: 1000 mg via INTRAVENOUS
  Filled 2015-02-13: qty 200

## 2015-02-13 MED ORDER — HEPARIN SODIUM (PORCINE) 5000 UNIT/ML IJ SOLN: 60.0000 [IU]/kg | INTRAMUSCULAR | Status: DC

## 2015-02-13 MED ORDER — LIDOCAINE HCL (CARDIAC) 20 MG/ML IV SOLN
INTRAVENOUS | Status: AC
  Filled 2015-02-13: qty 5

## 2015-02-14 LAB — URINE CULTURE: Culture: NO GROWTH

## 2015-02-15 ENCOUNTER — Telehealth: Payer: Self-pay

## 2015-02-15 NOTE — Telephone Encounter (Signed)
On 02/15/2015 I received a death certificate from [facility]. The death certificate is for cremation. The patient is a patient of Doctor Titus Mould.  The death certificate will be taken to [facility]) tomorrow for signature. On 02-27-2015 I received the death certificate back from [place] who ended up signing the death certificate. I got the death certificate ready for pickup and faxed a copy to the funeral home per their request and put the original in the folder in medical records.

## 2015-02-16 LAB — CULTURE, BLOOD (ROUTINE X 2)

## 2015-02-17 ENCOUNTER — Other Ambulatory Visit: Payer: Self-pay

## 2015-02-17 ENCOUNTER — Encounter: Payer: Self-pay | Admitting: Nutrition

## 2015-02-17 ENCOUNTER — Ambulatory Visit: Payer: Self-pay

## 2015-02-18 LAB — CULTURE, BLOOD (ROUTINE X 2): Culture: NO GROWTH

## 2015-02-24 ENCOUNTER — Ambulatory Visit: Payer: Self-pay | Admitting: Physician Assistant

## 2015-02-24 ENCOUNTER — Other Ambulatory Visit: Payer: Self-pay

## 2015-02-24 ENCOUNTER — Ambulatory Visit: Payer: Self-pay

## 2015-03-03 ENCOUNTER — Other Ambulatory Visit: Payer: Self-pay

## 2015-03-08 ENCOUNTER — Ambulatory Visit: Payer: Self-pay | Admitting: Radiation Oncology

## 2015-03-10 ENCOUNTER — Other Ambulatory Visit: Payer: Self-pay

## 2015-03-10 ENCOUNTER — Ambulatory Visit: Payer: Self-pay

## 2015-03-13 NOTE — Progress Notes (Signed)
Patient terminally extubated per MD order. No complications and family and RN at bedside. Rt will continue to monitor.

## 2015-03-13 NOTE — ED Notes (Signed)
Dr. Fernande Boyden with Critical Care notified of patient passing.

## 2015-03-13 NOTE — ED Notes (Signed)
Pt states she fell out of bed this AM pain in Chest.  A& O with EMS, Non responsive with this RN, SATS 72 RA.

## 2015-03-13 NOTE — Consult Note (Signed)
Reason for Consult: STEMI   Referring Physician: ER MD   PCP:  Kathlen Brunswick, PA-C  Primary Cardiologist:New Dr. Marga Hoots Enoch is an 64 y.o. female.    Chief Complaint: came to ER by EMS for AMS, had fallen earlier today   HPI: 64 year old female with no prior documented CAD + Lung cancer with metastasis to brain followed by Dr. Earlie Server and by last note thoughts were either to attempt more chemo Vs palative care- and pt had wanted to proceed with further chemo which was to begin 02/16/15.    Today had AMS and fell EMS called, on arrival EKG with STEMI inf wall and tachycardia, in ER pt without chest pain but quickly developed agonal respirations. She has been intubated on vent.  Hypotensive.   + rectal bleeding and septic.  Dr. Gwenlyn Found and Dr. Harl Bowie have talked to the family and plan is not to take to the cath lab.      Past Medical History  Diagnosis Date  . Uterine cancer   . Anxiety   . Lupus   . Lung cancer     stage IV non small cell lung cancer, squamous cell carcinoma dx May 2016    Past Surgical History  Procedure Laterality Date  . Appendectomy    . Cholecystectomy    . Abdominal hysterectomy      Family History  Problem Relation Age of Onset  . Cancer Mother   . Cancer Brother   . Heart disease Father    Social History:  reports that she has been smoking Cigarettes.  She has a 41 pack-year smoking history. She has never used smokeless tobacco. She reports that she drinks about 7.2 oz of alcohol per week. She reports that she uses illicit drugs.  Allergies: No Known Allergies  Outpatient mEDICATIONS: No current facility-administered medications on file prior to encounter.   Current Outpatient Prescriptions on File Prior to Encounter  Medication Sig Dispense Refill  . ciprofloxacin (CIPRO) 500 MG tablet Take 500 mg by mouth 2 (two) times daily. Disp. 20 tablets. 0 Refills.  Called in to [facility]  per Dr. Tammi Klippel.     Marland Kitchen dexamethasone (DECADRON) 4 MG tablet Take 1 tablet (4 mg total) by mouth 3 (three) times daily. 90 tablet 0  . OxyCODONE (OXYCONTIN) 10 mg T12A 12 hr tablet Take 1 tablet (10 mg total) by mouth every 12 (twelve) hours. 60 tablet 0  . docusate sodium (COLACE) 100 MG capsule Take 1 capsule (100 mg total) by mouth 2 (two) times daily. (Patient not taking: Reported on 01/19/2015) 10 capsule 0  . fluconazole (DIFLUCAN) 200 MG tablet Take 1 tablet (200 mg total) by mouth daily. 10 tablet 0  . HYDROcodone-acetaminophen (NORCO) 7.5-325 MG per tablet Take 1 tablet by mouth every 6 (six) hours as needed for moderate pain. 40 tablet 0  . LORazepam (ATIVAN) 0.5 MG tablet Take 1 tablet (0.5 mg total) by mouth every 8 (eight) hours as needed for anxiety. 40 tablet 0  . polyethylene glycol powder (GLYCOLAX/MIRALAX) powder Take 17 g by mouth 2 (two) times daily as needed. 3350 g 1     No results found for this or any previous visit (from the past 48 hour(s)). No results found.  ROS: unable to obtain, pt intubated  VS taken continuously   There were no vitals taken for this visit.  Wt Readings from Last 3 Encounters:  01/26/15 95 lb (43.092  kg)  01/24/15 95 lb (43.092 kg)  01/17/15 97 lb 8 oz (44.226 kg)    PE: General:unresponsive, intubated on vent Skin:cool and dry, brisk capillary refill HEENT:normocephalic, sclera clear, mucus membranes moist Neck:supple, no JVD, no bruits  Heart:muffled heart sounds without murmur, gallup, rub or click Lungs:clear, ant  without rales, rhonchi, or wheezes DZH:GDJM, non tender, hypoactive BS, do not palpate liver spleen or masses Ext:no lower ext edema, ?+ pedal pulses, + femoral pulses, 2+ radial pulses Neuro:not responding.  MAE, follows commands, + facial symmetry    Assessment/Plan Sepsis- with respiratory arrest- intubated on vent. CCM admitting and managing.  Septic Shock and cardiogenic shock  STEMI inf wall. - Drs. Gwenlyn Found and [employer] along with ER  MD discussed with family.  Pt with rectal bleeding and hx of GI bleed and lung cancer and metastasis to brain. Will not take to cath lab with rectal bleeding. Family and  MDs agreed.   Lung cancer with brain metastasis. Stage IV (T3, N0, M1b) non-small cell lung cancer, squamous cell carcinoma diagnosed in May 2016. Previous therapy  1) Stereotactic radiotherapy to 2 metastatic brain lesions under the care of Dr. Tammi Klippel completed on 01/27/2015. 2) Palliative radiotherapy to the left paraspinal mass under the care of Dr. Sondra Come. CURRENT THERAPY: Systemic chemotherapy with carboplatin for AUC of 5 on day 1, gemcitabine 1000 MG/M2 on days 1 and 8 as well as Portrazza 800 MG IV on days 1 and 8 every 3 weeks. First cycle 02/16/2015  [facility]  Nurse Practitioner Certified [place] Pager [phone] or after 5pm or weekends call [phone] 02-28-15, 1:56 PM  Attending note  Please see my separate signed progress note   Zandra Abts MD

## 2015-03-13 NOTE — Progress Notes (Signed)
Patient seen in ER with both ER staff and Dr Gwenlyn Found interventional cardiology. Presents with multiple acute medical issues including sepsis, GI bleed, and acute inferoposterior and lateral STEMI. She has known stage IV cancer with mets to brain, most recent heme/onc note mentions discussions with family for palliative care/hospice, however they elected to undergo trial of chemo. From cardiac standpoint we are limited in treatment options due to metastatic CA with brain mets, any coronary intervention would require anticoagulation along with commitment to DAPT. With brain mets very high risk of bleeding. Given overall very poor prognosis our recommendation is medical therapy only at this time with consideration for palliative/comfort care.   Zandra Abts MD

## 2015-03-13 NOTE — ED Notes (Addendum)
Time of Death 72. Pronounced by Dr. Maryan Rued.

## 2015-03-13 NOTE — ED Notes (Signed)
Pt family at bedside with patient. No response from patient. Pt remains on ventilator. Comfort care on monitor.

## 2015-03-13 NOTE — H&P (Signed)
Name: Laura Vang MRN: [id number] DOB: 02-08-1951    ADMISSION DATE:  2015/03/13 CONSULTATION DATE:  Mar 13, 2015  REFERRING MD : Delorise Jackson, MD  CHIEF COMPLAINT:  Change in MS  BRIEF PATIENT DESCRIPTION: 46 non small cell cancer, mets brain, change in MS   HISTORY OF PRESENT ILLNESS:  64 yr old non small cell stage 4, mets spijne, brain, increased mets on MRI even after radiation. Presented with change in MS. TO cone, in triage, sudden change in ms., no CP, no SOB. ETT placed. No response on examination. Mild fever, occult blood positive. ABX started, sepsis concerns also. Called to admit. ECG with major  League diffuse ST changes elevation. Cards eval, not a candidate cath.  PAST MEDICAL HISTORY :   has a past medical history of Uterine cancer; Anxiety; Lupus; and Lung cancer.  has past surgical history that includes Appendectomy; Cholecystectomy; and Abdominal hysterectomy. Prior to Admission medications   Medication Sig Start Date End Date Taking? Authorizing Provider  ciprofloxacin (CIPRO) 500 MG tablet Take 500 mg by mouth 2 (two) times daily. Disp. 20 tablets. 0 Refills.  Called in to [facility]  per Dr. Tammi Klippel.   Yes Tyler Pita, MD  dexamethasone (DECADRON) 4 MG tablet Take 1 tablet (4 mg total) by mouth 3 (three) times daily. 01/20/15  Yes Curt Bears, MD  OxyCODONE (OXYCONTIN) 10 mg T12A 12 hr tablet Take 1 tablet (10 mg total) by mouth every 12 (twelve) hours. 01/24/15  Yes Gery Pray, MD  docusate sodium (COLACE) 100 MG capsule Take 1 capsule (100 mg total) by mouth 2 (two) times daily. Patient not taking: Reported on 01/19/2015 12/25/14   Tereasa Coop, PA-C  fluconazole (DIFLUCAN) 200 MG tablet Take 1 tablet (200 mg total) by mouth daily. 02/09/15   Curt Bears, MD  HYDROcodone-acetaminophen (NORCO) 7.5-325 MG per tablet Take 1 tablet by mouth every 6 (six) hours as needed for moderate pain. 02/03/15   Kyung Rudd, MD  LORazepam (ATIVAN) 0.5 MG tablet  Take 1 tablet (0.5 mg total) by mouth every 8 (eight) hours as needed for anxiety. 01/13/15   Kyung Rudd, MD  polyethylene glycol powder (GLYCOLAX/MIRALAX) powder Take 17 g by mouth 2 (two) times daily as needed. 12/25/14   Tereasa Coop, PA-C   No Known Allergies  FAMILY HISTORY:  family history includes Cancer in her brother and mother; Heart disease in her father. SOCIAL HISTORY:  reports that she has been smoking Cigarettes.  She has a 41 pack-year smoking history. She has never used smokeless tobacco. She reports that she drinks about 7.2 oz of alcohol per week. She reports that she uses illicit drugs.  REVIEW OF SYSTEMS:   Unable, coma  SUBJECTIVE:   VITAL SIGNS: Temp:  [99.3 F (37.4 C)-99.9 F (37.7 C)] 99.7 F (37.6 C) (07/04 1430) Pulse Rate:  [101-146] 146 (07/04 1430) Resp:  [16-17] 16 (07/04 1430) BP: (113-132)/(77-100) 132/87 mmHg (07/04 1430) SpO2:  [99 %-100 %] 100 % (07/04 1430) FiO2 (%):  [40 %-100 %] 40 % (07/04 1432) Weight:  [40.824 kg (90 lb)] 40.824 kg (90 lb) (07/04 1405)  PHYSICAL EXAMINATION: General:  Unresponsive to pain, per 46m Neuro:  No movment pain stimuli, no cough, no gag HEENT:  jvd wnl Cardiovascular: s1 s2 rrr distant Lungs:  Ronchi, no crackles Abdomen:  Soft, mass rlq?, no r/g Musculoskeletal:  No edema Skin: no rash   Recent Labs Lab 02/08/15 1149  NA 135*  K 4.0  CO2 22  BUN 28.7*  CREATININE 0.5*  GLUCOSE 160*    Recent Labs Lab 02/08/15 1149 02/19/15 1420  HGB 12.6 11.1*  HCT 36.9 33.6*  WBC 16.8* 31.2*  PLT 711* 369   Dg Chest Port 1 View  02/19/2015   CLINICAL DATA:  Chest pain this morning.  History of lung cancer.  EXAM: PORTABLE CHEST - 1 VIEW  COMPARISON:  PET-CT Jan 06, 2015  FINDINGS: The heart size is normal. Endotracheal tube is identified distal tip 9 mm from carina. Retraction by 2.5 cm is recommended. Nasogastric tube is identified with distal tip in the midesophagus. Advancement is recommended. There is  a mass in the medial right upper lobe consistent with patient's known cancer. There is no focal infiltrate, pulmonary edema, or pleural effusion.  IMPRESSION: Endotracheal tube as described. Retraction by 2.5 cm is recommended. Nasogastric tube with distal tip in the mid esophagus, advancement is recommended.   Electronically Signed   By: Abelardo Diesel M.D.   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 increasing Anemia Diffuse St elevation (MI vs intracranial event, ICH?) Gi bleed R/o sepsis  Extensive d/w husband, sons, she is severely debilitated at baseline and not thriving I am concerned about bleed mets as cause ST changes although she is 40 year sig smoker Cards eval noted She has a horrible prognosis Family has decided for comfort care now in ER, to floor if needed Titrate morphine to rr 12-18 or pain All questions answered in full Dc all other meds To Room air when ready for comfort No role CT head Will assess cpap 5 ps 5 intermittent for 30 secs to adjust morphine Rate to 12 on vent now  Ccm time 35 min  Pulmonary and [place] Pager: [phone]  19-Feb-2015, 3:06 PM

## 2015-03-13 NOTE — ED Notes (Signed)
Pt family at bedside with patient.

## 2015-03-13 NOTE — ED Notes (Signed)
Sedations drips stopped. Given to pharmacy tech to return to main pharmacy.

## 2015-03-13 NOTE — Progress Notes (Signed)
Chaplain responded originally to code STEMI.  Code cancelled, but family in need of support.  Chaplain provided emotional support, especially to pt's spouse and younger son who have been grieving, in consult room and at bedside.  Pt's husband was stating that "she would not want to go on this way but the family will have to make those decisions."  Chaplain provided ministry of presence and hospitality through refreshments.  Chaplain handed case off to another chaplain who is available if further support needed.      03/05/2015 1500  Clinical Encounter Type  Visited With Patient and family together;Health care provider  Visit Type Initial;Spiritual support;Social support;ED  Referral From Care management  Spiritual Encounters  Spiritual Needs Emotional;Grief support  Stress Factors  Patient Stress Factors Health changes  Family Stress Factors Family relationships;Health changes;Lack of knowledge;Loss;Major life changes   Geralyn Flash 2015/03/05 3:04 PM

## 2015-03-13 NOTE — ED Notes (Signed)
Pt extubated by respiratory. Family at bedside. Unlabored respirations.

## 2015-03-13 NOTE — Procedures (Signed)
Intubation Procedure Note Laura Vang [id number] [birth year]  Procedure: Intubation Indications: Airway protection and maintenance  Procedure Details Consent: Unable to obtain consent because of emergent medical necessity. Time Out: Verified patient identification, verified procedure, site/side was marked, verified correct patient position, special equipment/implants available, medications/allergies/relevent history reviewed, required imaging and test results available.  Performed  Maximum sterile technique was used including gloves and hand hygiene.  MAC and 3    Evaluation Hemodynamic Status: BP stable throughout; O2 sats: stable throughout Patient's Current Condition: stable Complications: No apparent complications Patient did tolerate procedure well. Chest X-ray ordered to verify placement.  CXR: tube position acceptable.   Kelle Darting 14-Mar-2015

## 2015-03-13 NOTE — Progress Notes (Signed)
RT retracted patient ETT tube 3cm per Verbal MD order. Patient tolerated well. Vital signs stable at this time. No complications. RT will continue to monitor.

## 2015-03-13 NOTE — ED Provider Notes (Signed)
Pt pronounced dead at 1639.  Asystole on the rhythm strip and no heart rate or spontaneous breathing. Pupils are fixed   Blanchie Dessert, MD 2015-02-17 [phone]

## 2015-03-13 NOTE — ED Notes (Signed)
Pt. Transferred to [facility], pt. Is unresponsive.  Dr. Zenia Resides at the bedside.

## 2015-03-13 NOTE — Progress Notes (Signed)
   2015/02/25 1600  Clinical Encounter Type  Visited With Family;Health care provider  Visit Type Death;Patient actively dying  Referral From Nurse  Consult/Referral To Chaplain  Spiritual Encounters  Spiritual Needs Prayer;Grief support;Emotional  Stress Factors  Family Stress Factors Loss  PT actively dying followed by death; [place] called to aid in grief and spiritual care of family; liaison with RN and family for final visit as PT transitioned.  Provided RN with funeral home information.

## 2015-03-13 NOTE — Progress Notes (Signed)
ANTIBIOTIC CONSULT NOTE - INITIAL  Pharmacy Consult for Vancomycin and Zosyn Indication: rule out sepsis  No Known Allergies  Patient Measurements: Height: '5\' 4"'$  (162.6 cm) Weight: 90 lb (40.824 kg) IBW/kg (Calculated) : 54.7  Vital Signs: Temp: 99.3 F (37.4 C) (07/04 1416) Temp Source: Core (Comment) (07/04 1416) BP: 115/77 mmHg (07/04 1416) Pulse Rate: 135 (07/04 1416) Intake/Output from previous day:   Intake/Output from this shift: Total I/O In: 2000 [I.V.:2000] Out: -   Labs: No results for input(s): WBC, HGB, PLT, LABCREA, CREATININE in the last 72 hours. Estimated Creatinine Clearance: 47 mL/min (by C-G formula based on Cr of 0.5). No results for input(s): VANCOTROUGH, VANCOPEAK, VANCORANDOM, GENTTROUGH, GENTPEAK, GENTRANDOM, TOBRATROUGH, TOBRAPEAK, TOBRARND, AMIKACINPEAK, AMIKACINTROU, AMIKACIN in the last 72 hours.   Microbiology: Recent Results (from the past 720 hour(s))  Urine culture     Status: None   Collection Time: 02/03/15 12:39 PM  Result Value Ref Range Status   Urine Culture, Routine Culture, Urine  Final    Comment: Final - ===== COLONY COUNT: ===== >=100,000 COLONIES/ML Multiple bacterial morphotypes present, none predominant. Suggest appropriate recollection if  clinically indicated.     Medical History: Past Medical History  Diagnosis Date  . Uterine cancer   . Anxiety   . Lupus   . Lung cancer     stage IV non small cell lung cancer, squamous cell carcinoma dx May 2016   Assessment: 64 yo F presents on 7/4 with AMS and fall. PMH includes lung cancer with metastasis to brain. Last oncology note contemplating more chemo vs. palliative care. On arrival EKG shows STEMI and tachycardia. Not a cath candidate with rectal bleeding. Pharmacy to start abx for sepsis. Zosyn and vancomycin 1g x 1 ordered in the ED. Last SCr on 6/29 was 0.5. CrCl ~40-19m/min.  Goal of Therapy:  Vancomycin trough level 15-20 mcg/ml  Resolution of  infection  Plan:  Zosyn 3.375g IV x 1, then start zosyn 3.375g IV Q8 Vancomycin 1g IV x 1, then start '750mg'$  IV Q24 Monitor clinical picture, renal function  Viann Nielson J [phone] PM

## 2015-03-13 NOTE — ED Provider Notes (Signed)
CSN: [id number]     Arrival date & time 03/06/15  1327 History   First MD Initiated Contact with Patient 06-Mar-2015 1328     Chief Complaint  Patient presents with  . Code Sepsis     (Consider location/radiation/quality/duration/timing/severity/associated sxs/prior Treatment) HPI Comments: Patient here due to altered mental status and decreased responsiveness at home. Patient was noted to be hypoxemic prior to arrival with room air sats in the 70s. On arrival here patient was initially talking and then became unresponsive. She does have a history of metastatic stage IV lung cancer. She recently just finished radiation treatment. No further history obtainable  The history is provided by the EMS personnel and medical records. The history is limited by the condition of the patient.    Past Medical History  Diagnosis Date  . Uterine cancer   . Anxiety   . Lupus   . 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 Appendectomy    . Cholecystectomy    . Abdominal hysterectomy     Family History  Problem Relation Age of Onset  . Cancer Mother   . Cancer Brother   . Heart disease Father    History  Substance Use Topics  . Smoking status: Current Every Day Smoker -- 1.00 packs/day for 41 years    Types: Cigarettes  . Smokeless tobacco: Never Used  . Alcohol Use: 7.2 oz/week    12 Cans of beer, 0 Standard drinks or equivalent per week   OB History    No data available     Review of Systems  Unable to perform ROS     Allergies  Review of patient's allergies indicates no known allergies.  Home Medications   Prior to Admission medications   Medication Sig Start Date End Date Taking? Authorizing Provider  ciprofloxacin (CIPRO) 500 MG tablet Take 500 mg by mouth 2 (two) times daily. Disp. 20 tablets. 0 Refills.  Called in to [facility]  per Dr. Tammi Klippel.   Yes Tyler Pita, MD  dexamethasone (DECADRON) 4 MG tablet Take 1 tablet (4 mg total) by mouth 3 (three) times daily. 01/20/15  Yes Curt Bears, MD  OxyCODONE (OXYCONTIN) 10 mg T12A 12 hr tablet Take 1 tablet (10 mg total) by mouth every 12 (twelve) hours. 01/24/15  Yes Gery Pray, MD  docusate sodium (COLACE) 100 MG capsule Take 1 capsule (100 mg total) by mouth 2 (two) times daily. Patient not taking: Reported on 01/19/2015 12/25/14   Tereasa Coop, PA-C  fluconazole (DIFLUCAN) 200 MG tablet Take 1 tablet (200 mg total) by mouth daily. 02/09/15   Curt Bears, MD  HYDROcodone-acetaminophen (NORCO) 7.5-325 MG per tablet Take 1 tablet by mouth every 6 (six) hours as needed for moderate pain. 02/03/15   Kyung Rudd, MD  LORazepam (ATIVAN) 0.5 MG tablet Take 1 tablet (0.5 mg total) by mouth every 8 (eight) hours as needed for anxiety. 01/13/15   Kyung Rudd, MD  polyethylene glycol powder (GLYCOLAX/MIRALAX) powder Take 17 g by mouth 2 (two) times daily as needed. 12/25/14   Tereasa Coop, PA-C   BP 115/77 mmHg  Pulse 135  Temp(Src) 99.3 F (37.4 C) (Core (Comment))  Resp 16  Ht '5\' 4"'$  (1.626 m)  Wt 90 lb (40.824 kg)  BMI 15.44 kg/m2  SpO2 99% Physical Exam  Constitutional: She appears cachectic. She appears toxic.  She has a sickly appearance. She appears ill. She appears distressed.  HENT:  Head: Normocephalic and atraumatic.  Eyes: Conjunctivae, EOM and lids are normal. Pupils are equal, round, and reactive to light.  Neck: Normal range of motion. Neck supple. No tracheal deviation present. No thyroid mass present.  Cardiovascular: Regular rhythm and normal heart sounds.  Tachycardia present.  Exam reveals no gallop.   No murmur heard. Pulmonary/Chest: Effort normal. No stridor. No respiratory distress. She has decreased breath sounds. She has no wheezes. She has no rhonchi. She has no rales.  Abdominal: Soft. Normal appearance and bowel sounds are normal. She exhibits no distension. There is no  tenderness. There is no rebound and no CVA tenderness.  Genitourinary:  Blood mixed in stool noted  Musculoskeletal: Normal range of motion. She exhibits no edema or tenderness.  Neurological: She is unresponsive. GCS eye subscore is 1. GCS verbal subscore is 1. GCS motor subscore is 1.  Skin: Skin is warm and dry. No abrasion and no rash noted.  Psychiatric: She has a normal mood and affect. Her speech is normal and behavior is normal.  Nursing note and vitals reviewed.   ED Course  INTUBATION Date/Time: 19-Feb-2015 2:24 PM Performed by: Lacretia Leigh Authorized by: Lacretia Leigh Consent: The procedure was performed in an emergent situation. Time out: Immediately prior to procedure a "time out" was called to verify the correct patient, procedure, equipment, support staff and site/side marked as required. Indications: airway protection and  respiratory failure Intubation method: direct Patient status: paralyzed (RSI) Preoxygenation: BVM Sedatives: etomidate Paralytic: rocuronium Laryngoscope size: Miller 4 Tube size: 7.5 mm Tube type: cuffed Number of attempts: 1 Cricoid pressure: no Cords visualized: yes Post-procedure assessment: CO2 detector and chest rise Breath sounds: equal Cuff inflated: yes ETT to lip: 24 cm Tube secured with: ETT holder Chest x-ray interpreted by radiologist. Chest x-ray findings: endotracheal tube too low Tube repositioned: tube repositioned successfully Patient tolerance: Patient tolerated the procedure well with no immediate complications   (including critical care time) Labs Review Labs Reviewed  I-STAT CG4 LACTIC ACID, ED - Abnormal; Notable for the following:    Lactic Acid, Venous 4.51 (*)    All other components within normal limits  CULTURE, BLOOD (ROUTINE X 2)  CULTURE, BLOOD (ROUTINE X 2)  URINE CULTURE  APTT  CBC  COMPREHENSIVE METABOLIC PANEL  PROTIME-INR  URINALYSIS, ROUTINE W REFLEX MICROSCOPIC (NOT AT [facility])  BLOOD GAS,  ARTERIAL  I-STAT TROPOININ, ED  POC OCCULT BLOOD, ED    Imaging Review Dg Chest Port 1 View  2015/02/19   CLINICAL DATA:  Chest pain this morning.  History of lung cancer.  EXAM: PORTABLE CHEST - 1 VIEW  COMPARISON:  PET-CT Jan 06, 2015  FINDINGS: The heart size is normal. Endotracheal tube is identified distal tip 9 mm from carina. Retraction by 2.5 cm is recommended. Nasogastric tube is identified with distal tip in the midesophagus. Advancement is recommended. There is a mass in the medial right upper lobe consistent with patient's known cancer. There is no focal infiltrate, pulmonary edema, or pleural effusion.  IMPRESSION: Endotracheal tube as described. Retraction by 2.5 cm is recommended. Nasogastric tube with distal tip in the mid esophagus, advancement is recommended.   Electronically Signed   By: Abelardo Diesel M.D.   On: 02/19/15 14:13     EKG Interpretation   Date/Time:  Feb 19, 2015 13:32:15 EDT Ventricular Rate:  122 PR Interval:  183 QRS Duration: 101 QT Interval:  373 QTC Calculation: 531 R Axis:   90 Text Interpretation:  Sinus or ectopic atrial tachycardia Inferoposterior  infarct, acute (RCA) Anterolateral infarct, acute Prolonged QT interval  Probable RV involvement, suggest recording right precordial leads  Confirmed by Charliegh Vasudevan  MD, Kenith Trickel ([id number]) on March 01, 2015 2:23:04 PM      MDM   Final diagnoses:  None       patient's EKG consistent with acute inferior lateral wall . Given aspirin rectally. Patient intubated as above. Patient was hypotensive and required aggressive IV fluid resuscitation. Spoke with cardiologist who has seen the patient and given her overall poor prognosis as well as active GI bleed she is not a candidate for acute intervention. Patient started on empiric antibiotics for sepsis of unknown etiology. Chest x-ray results are as noted above. Tube was repositioned. Spoke with family at length and they're requesting patient be a full code  still. Spoke with critical care and they will see the patient.   CRITICAL CARE Performed by: Leota Jacobsen Total critical care time: 60 Critical care time was exclusive of separately billable procedures and treating other patients. Critical care was necessary to treat or prevent imminent or life-threatening deterioration. Critical care was time spent personally by me on the following activities: development of treatment plan with patient and/or surrogate as well as nursing, discussions with consultants, evaluation of patient's response to treatment, examination of patient, obtaining history from patient or surrogate, ordering and performing treatments and interventions, ordering and review of laboratory studies, ordering and review of radiographic studies, pulse oximetry and re-evaluation of patient's condition.     Lacretia Leigh, MD 03/01/15 1426

## 2015-03-13 NOTE — ED Notes (Signed)
Dr. Titus Mould at bedside talking with patient. Reports patient is DNR. Family at bedside.

## 2015-03-13 NOTE — ED Notes (Signed)
Paged the chaplain

## 2015-03-13 NOTE — ED Notes (Signed)
Cardiology PA at bedside, Dr. Zenia Resides talking with patient's family. ST elevation appears to have worsened cardiology made aware.

## 2015-03-13 DEATH — deceased

## 2015-03-17 ENCOUNTER — Ambulatory Visit: Payer: Self-pay

## 2015-03-17 ENCOUNTER — Other Ambulatory Visit: Payer: Self-pay

## 2015-11-24 IMAGING — CR DG ABDOMEN ACUTE W/ 1V CHEST
4 series · 4 of 4 positions shown · non-contrast
Comparison: None.

CLINICAL DATA: Abdominal pain and 15 lb weight loss.

EXAM:
DG ABDOMEN ACUTE W/ 1V CHEST

[PA]
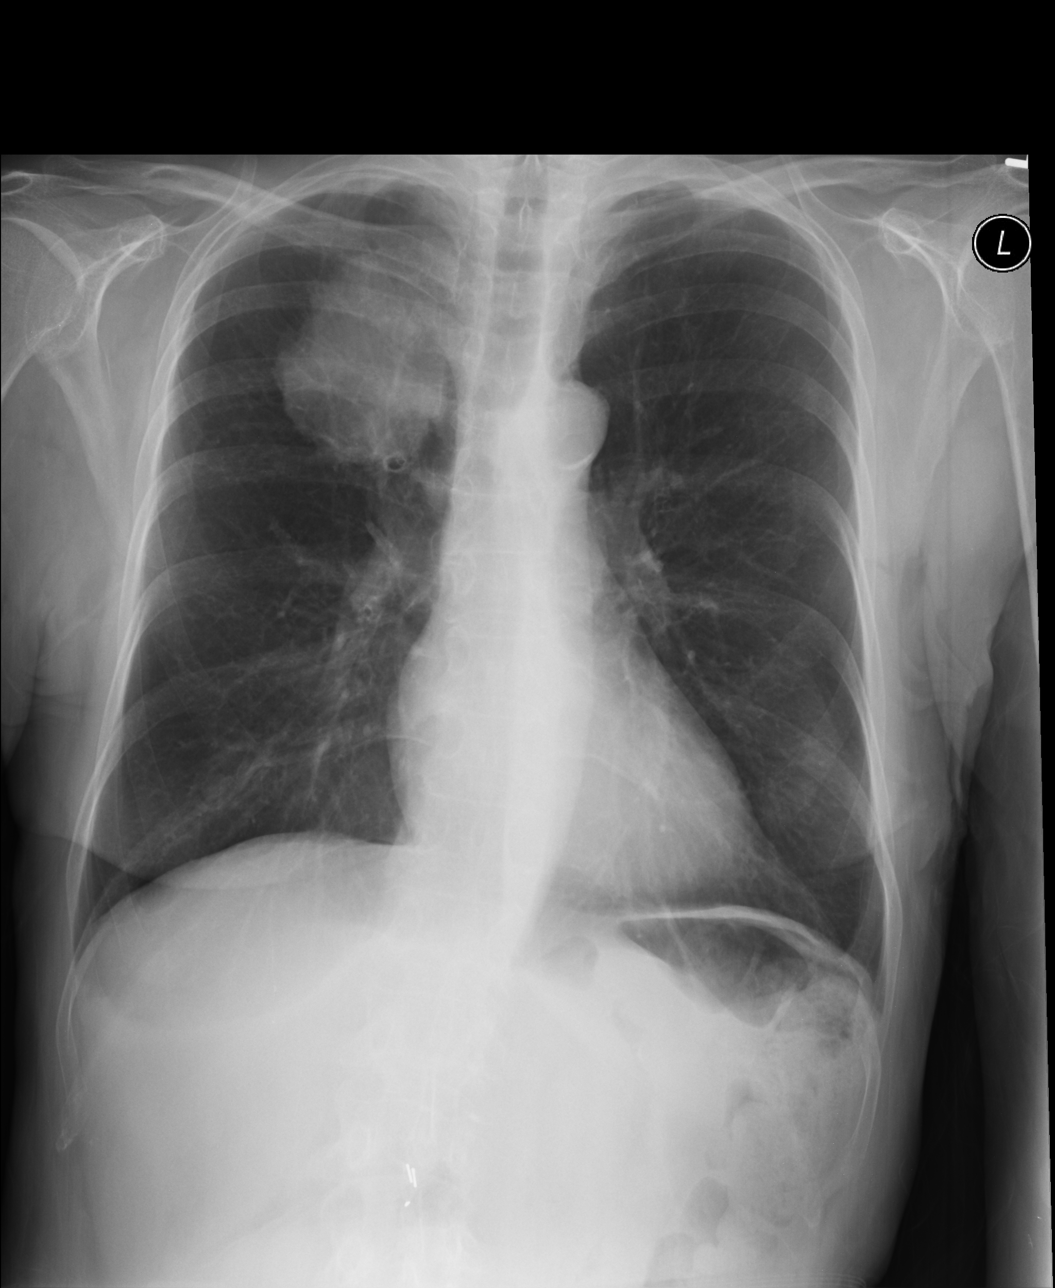

[AP (1 of 2)]
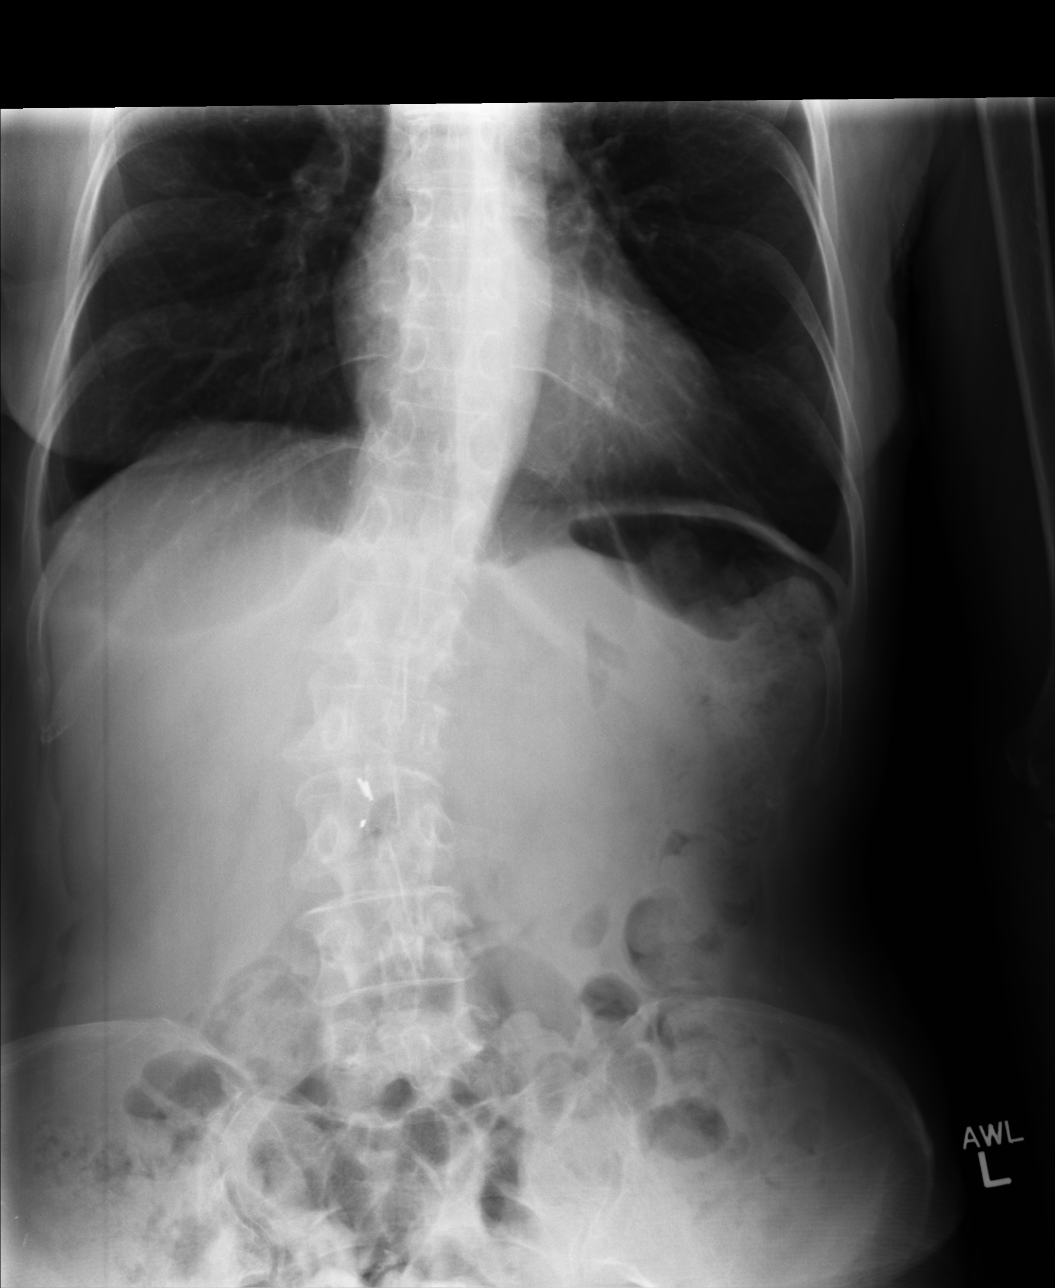

[AP (2 of 2)]
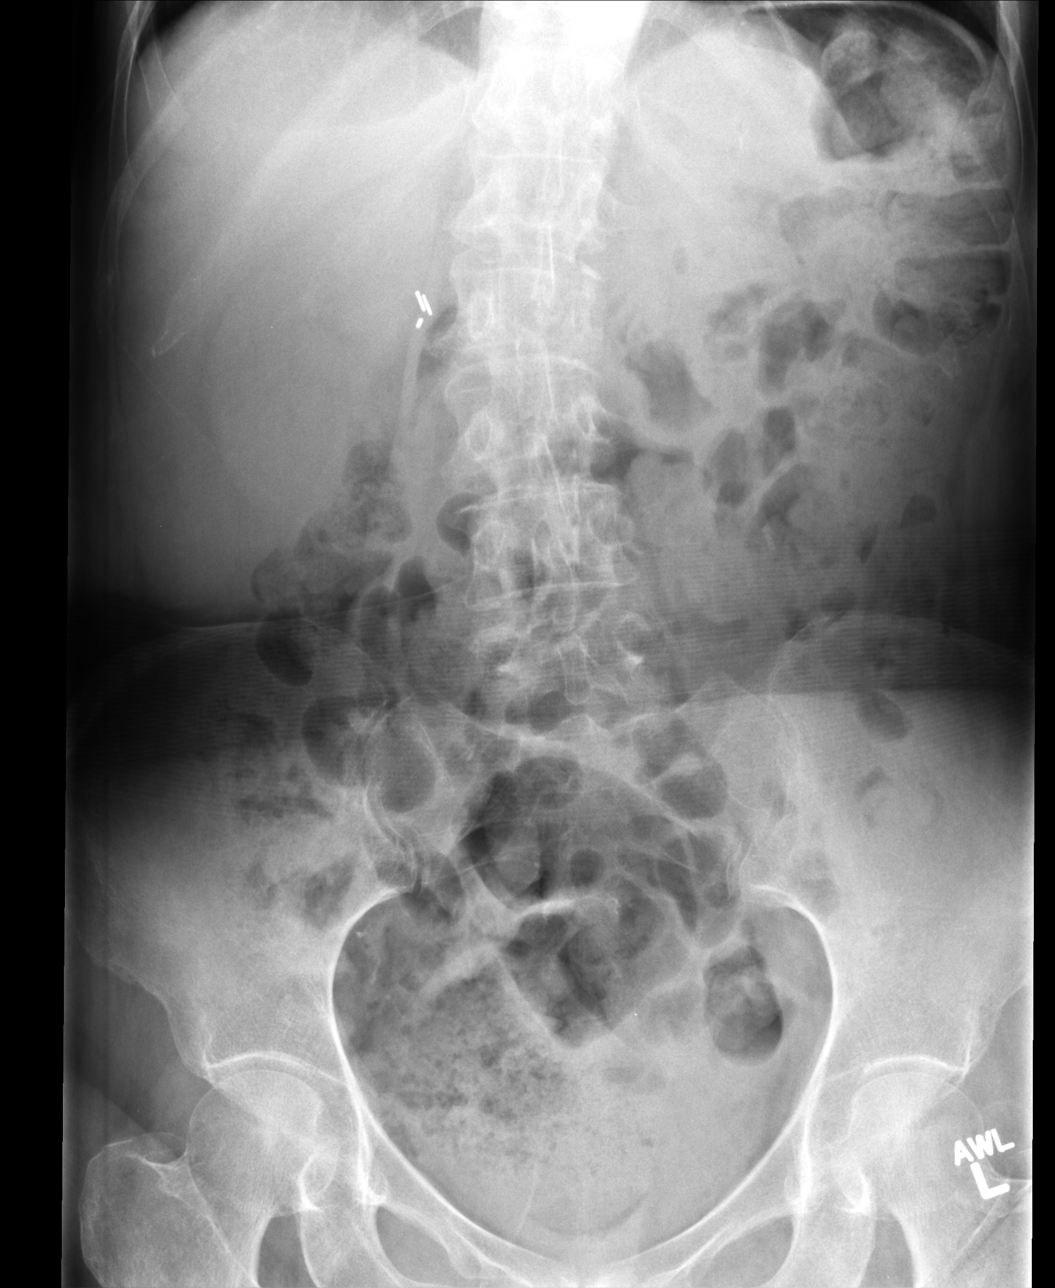

[lateral]
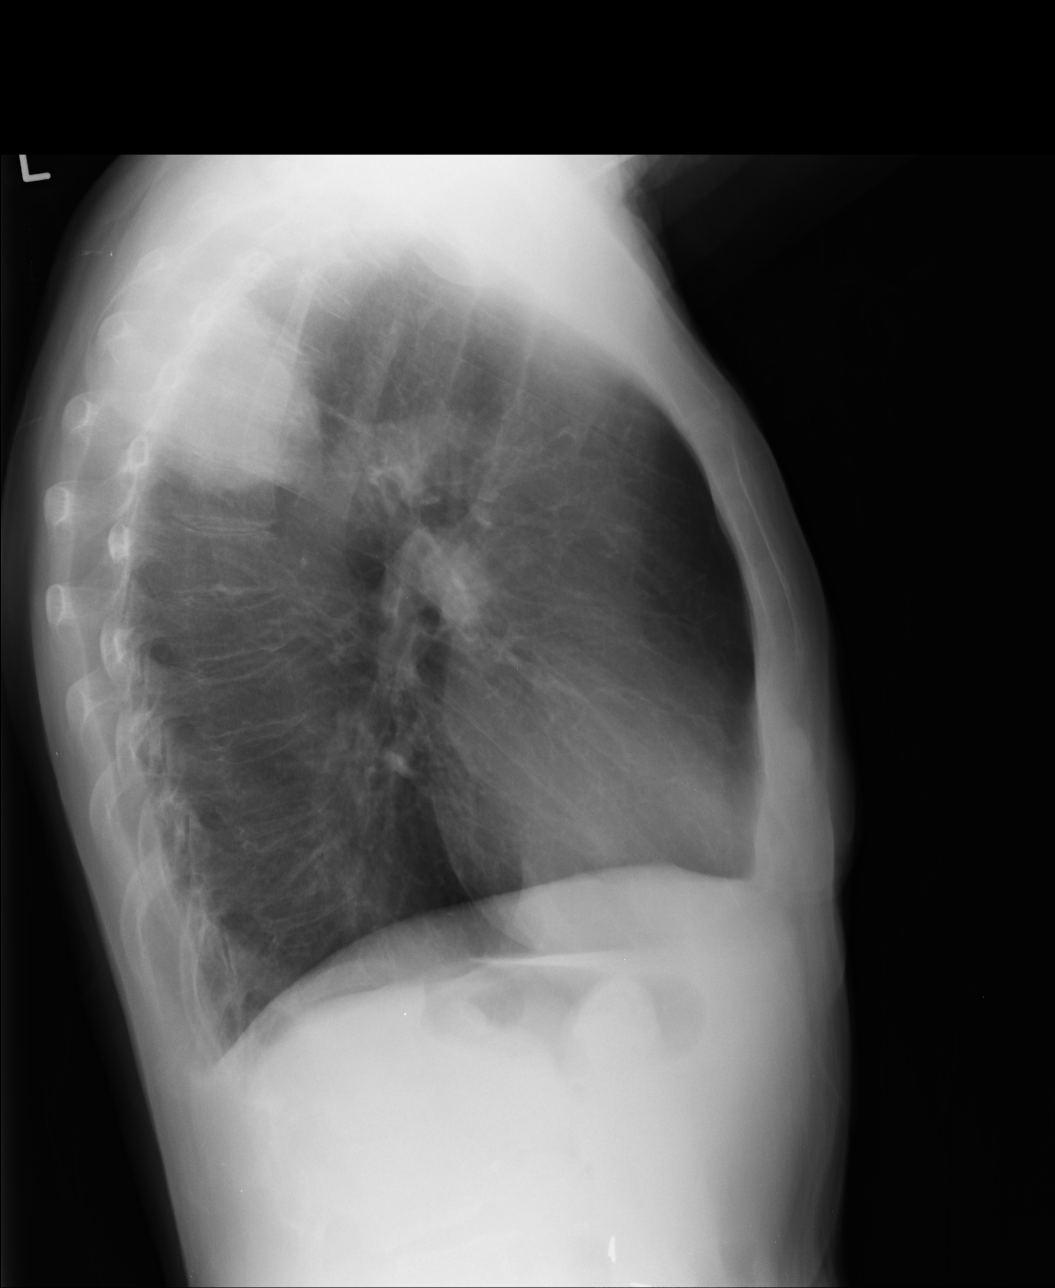

[4 of 4 positions shown; findings below may reference images not displayed]

FINDINGS: There is no evidence of dilated bowel loops or free intraperitoneal
air. Marked bowel content is identified throughout colon. Prior
cholecystectomy clips are identified. There is scoliosis of spine.
No radiopaque calculi or other significant radiographic abnormality
is seen. Heart size and mediastinal contours are within normal
limits. There is a 5.9 x 7.5 cm mass in the medial right upper lobe.
IMPRESSION: Negative abdominal radiographs.  Constipation.

Large mass in the right upper lobe. Further evaluation with a chest
CT is recommended.

## 2016-01-29 ENCOUNTER — Other Ambulatory Visit: Payer: Self-pay | Admitting: Nurse Practitioner

## 2016-01-30 ENCOUNTER — Other Ambulatory Visit: Payer: Self-pay | Admitting: Nurse Practitioner

## 2020-05-22 ENCOUNTER — Other Ambulatory Visit: Payer: Self-pay | Admitting: Orthopedic Surgery
